# Patient Record
Sex: Female | Born: 1935
Health system: Southern US, Community
[De-identification: ages and names within clinical notes are randomized; demographics above are authoritative.]

## PROBLEM LIST (undated history)

## (undated) DIAGNOSIS — K219 Gastro-esophageal reflux disease without esophagitis: Secondary | ICD-10-CM

## (undated) DIAGNOSIS — C4492 Squamous cell carcinoma of skin, unspecified: Secondary | ICD-10-CM

## (undated) DIAGNOSIS — N3090 Cystitis, unspecified without hematuria: Secondary | ICD-10-CM

## (undated) DIAGNOSIS — G2581 Restless legs syndrome: Secondary | ICD-10-CM

## (undated) DIAGNOSIS — M1990 Unspecified osteoarthritis, unspecified site: Secondary | ICD-10-CM

## (undated) DIAGNOSIS — M8580 Other specified disorders of bone density and structure, unspecified site: Secondary | ICD-10-CM

## (undated) DIAGNOSIS — K589 Irritable bowel syndrome without diarrhea: Secondary | ICD-10-CM

## (undated) DIAGNOSIS — K8020 Calculus of gallbladder without cholecystitis without obstruction: Secondary | ICD-10-CM

## (undated) DIAGNOSIS — K635 Polyp of colon: Secondary | ICD-10-CM

## (undated) DIAGNOSIS — M7500 Adhesive capsulitis of unspecified shoulder: Secondary | ICD-10-CM

## (undated) DIAGNOSIS — K449 Diaphragmatic hernia without obstruction or gangrene: Secondary | ICD-10-CM

## (undated) HISTORY — PX: CHOLECYSTECTOMY: SHX55

## (undated) HISTORY — PX: MOHS SURGERY: SUR867

## (undated) HISTORY — DX: Gastro-esophageal reflux disease without esophagitis: K21.9

## (undated) HISTORY — DX: Polyp of colon: K63.5

## (undated) HISTORY — PX: APPENDECTOMY: SHX54

## (undated) HISTORY — PX: CATARACT EXTRACTION: SUR2

## (undated) HISTORY — DX: Diaphragmatic hernia without obstruction or gangrene: K44.9

## (undated) HISTORY — DX: Restless legs syndrome: G25.81

## (undated) HISTORY — DX: Irritable bowel syndrome, unspecified: K58.9

## (undated) HISTORY — DX: Cystitis, unspecified without hematuria: N30.90

## (undated) HISTORY — DX: Adhesive capsulitis of unspecified shoulder: M75.00

## (undated) HISTORY — DX: Squamous cell carcinoma of skin, unspecified: C44.92

## (undated) HISTORY — DX: Unspecified osteoarthritis, unspecified site: M19.90

## (undated) HISTORY — DX: Calculus of gallbladder without cholecystitis without obstruction: K80.20

## (undated) HISTORY — DX: Other specified disorders of bone density and structure, unspecified site: M85.80

---

## 2001-04-02 ENCOUNTER — Other Ambulatory Visit: Admission: RE | Admit: 2001-04-02 | Discharge: 2001-04-02 | Payer: Self-pay | Admitting: Family Medicine

## 2001-08-12 ENCOUNTER — Encounter: Payer: Self-pay | Admitting: Family Medicine

## 2001-08-12 ENCOUNTER — Encounter: Admission: RE | Admit: 2001-08-12 | Discharge: 2001-08-12 | Payer: Self-pay | Admitting: Family Medicine

## 2001-10-12 ENCOUNTER — Emergency Department (HOSPITAL_COMMUNITY): Admission: EM | Admit: 2001-10-12 | Discharge: 2001-10-13 | Payer: Self-pay | Admitting: *Deleted

## 2001-11-18 ENCOUNTER — Ambulatory Visit (HOSPITAL_COMMUNITY): Admission: RE | Admit: 2001-11-18 | Discharge: 2001-11-18 | Payer: Self-pay | Admitting: Gastroenterology

## 2001-11-18 ENCOUNTER — Encounter: Payer: Self-pay | Admitting: Gastroenterology

## 2002-04-16 ENCOUNTER — Encounter: Payer: Self-pay | Admitting: Emergency Medicine

## 2002-04-16 ENCOUNTER — Observation Stay (HOSPITAL_COMMUNITY): Admission: EM | Admit: 2002-04-16 | Discharge: 2002-04-17 | Payer: Self-pay | Admitting: Emergency Medicine

## 2003-05-09 ENCOUNTER — Other Ambulatory Visit: Admission: RE | Admit: 2003-05-09 | Discharge: 2003-05-09 | Payer: Self-pay | Admitting: Family Medicine

## 2005-01-15 ENCOUNTER — Encounter: Admission: RE | Admit: 2005-01-15 | Discharge: 2005-02-14 | Payer: Self-pay | Admitting: Family Medicine

## 2005-08-07 ENCOUNTER — Other Ambulatory Visit: Admission: RE | Admit: 2005-08-07 | Discharge: 2005-08-07 | Payer: Self-pay | Admitting: Family Medicine

## 2005-08-28 ENCOUNTER — Ambulatory Visit: Payer: Self-pay | Admitting: Gastroenterology

## 2005-10-15 ENCOUNTER — Encounter: Admission: RE | Admit: 2005-10-15 | Discharge: 2005-11-21 | Payer: Self-pay | Admitting: Family Medicine

## 2006-03-24 ENCOUNTER — Encounter: Admission: RE | Admit: 2006-03-24 | Discharge: 2006-03-24 | Payer: Self-pay | Admitting: Family Medicine

## 2006-04-09 ENCOUNTER — Ambulatory Visit: Payer: Self-pay | Admitting: Gastroenterology

## 2006-04-17 ENCOUNTER — Ambulatory Visit: Payer: Self-pay | Admitting: Gastroenterology

## 2006-04-17 ENCOUNTER — Encounter: Payer: Self-pay | Admitting: Gastroenterology

## 2006-08-12 ENCOUNTER — Ambulatory Visit: Payer: Self-pay | Admitting: Gastroenterology

## 2006-08-15 ENCOUNTER — Ambulatory Visit: Payer: Self-pay | Admitting: Gastroenterology

## 2006-09-03 ENCOUNTER — Ambulatory Visit: Payer: Self-pay | Admitting: Gastroenterology

## 2006-09-03 LAB — CONVERTED CEMR LAB
ALT: 14 units/L (ref 0–40)
AST: 17 units/L (ref 0–37)
Albumin: 4 g/dL (ref 3.5–5.2)
Alkaline Phosphatase: 50 units/L (ref 39–117)
Basophils Absolute: 0 10*3/uL (ref 0.0–0.1)
Basophils Relative: 0.5 % (ref 0.0–1.0)
Bilirubin, Direct: 0.1 mg/dL (ref 0.0–0.3)
Eosinophils Absolute: 0 10*3/uL (ref 0.0–0.6)
Eosinophils Relative: 0.6 % (ref 0.0–5.0)
HCT: 38.1 % (ref 36.0–46.0)
Hemoglobin: 13.4 g/dL (ref 12.0–15.0)
Lymphocytes Relative: 28.3 % (ref 12.0–46.0)
MCHC: 35.1 g/dL (ref 30.0–36.0)
MCV: 91.5 fL (ref 78.0–100.0)
Monocytes Absolute: 0.5 10*3/uL (ref 0.2–0.7)
Monocytes Relative: 7.7 % (ref 3.0–11.0)
Neutro Abs: 4.2 10*3/uL (ref 1.4–7.7)
Neutrophils Relative %: 62.9 % (ref 43.0–77.0)
Platelets: 323 10*3/uL (ref 150–400)
RBC: 4.17 M/uL (ref 3.87–5.11)
RDW: 12 % (ref 11.5–14.6)
TSH: 1.76 microintl units/mL (ref 0.35–5.50)
Total Bilirubin: 0.6 mg/dL (ref 0.3–1.2)
Total Protein: 6.9 g/dL (ref 6.0–8.3)
WBC: 6.6 10*3/uL (ref 4.5–10.5)

## 2006-09-04 ENCOUNTER — Ambulatory Visit: Payer: Self-pay | Admitting: Cardiology

## 2006-09-16 ENCOUNTER — Ambulatory Visit: Payer: Self-pay

## 2007-01-12 ENCOUNTER — Encounter: Admission: RE | Admit: 2007-01-12 | Discharge: 2007-01-12 | Payer: Self-pay | Admitting: Family Medicine

## 2007-10-14 ENCOUNTER — Ambulatory Visit: Payer: Self-pay | Admitting: Gastroenterology

## 2007-10-14 ENCOUNTER — Encounter: Payer: Self-pay | Admitting: Gastroenterology

## 2007-10-14 DIAGNOSIS — K219 Gastro-esophageal reflux disease without esophagitis: Secondary | ICD-10-CM

## 2007-10-14 LAB — CONVERTED CEMR LAB
ALT: 14 units/L (ref 0–35)
Albumin: 3.9 g/dL (ref 3.5–5.2)
BUN: 6 mg/dL (ref 6–23)
Basophils Relative: 0.9 % (ref 0.0–1.0)
Bilirubin, Direct: 0.1 mg/dL (ref 0.0–0.3)
CO2: 33 meq/L — ABNORMAL HIGH (ref 19–32)
Calcium: 9.3 mg/dL (ref 8.4–10.5)
Eosinophils Relative: 1.2 % (ref 0.0–5.0)
Glucose, Bld: 100 mg/dL — ABNORMAL HIGH (ref 70–99)
HCT: 37.7 % (ref 36.0–46.0)
Hemoglobin: 12.8 g/dL (ref 12.0–15.0)
Lymphocytes Relative: 33.1 % (ref 12.0–46.0)
Monocytes Relative: 10.7 % (ref 3.0–12.0)
Neutro Abs: 2.7 10*3/uL (ref 1.4–7.7)
RBC: 4.04 M/uL (ref 3.87–5.11)
Sodium: 140 meq/L (ref 135–145)
Total Protein: 6.5 g/dL (ref 6.0–8.3)
WBC: 5 10*3/uL (ref 4.5–10.5)

## 2010-08-08 ENCOUNTER — Encounter: Payer: Self-pay | Admitting: Cardiology

## 2010-08-31 ENCOUNTER — Encounter: Payer: Self-pay | Admitting: Cardiology

## 2010-08-31 ENCOUNTER — Institutional Professional Consult (permissible substitution) (INDEPENDENT_AMBULATORY_CARE_PROVIDER_SITE_OTHER): Payer: Medicare Other | Admitting: Cardiology

## 2010-08-31 DIAGNOSIS — R079 Chest pain, unspecified: Secondary | ICD-10-CM | POA: Insufficient documentation

## 2010-08-31 DIAGNOSIS — I739 Peripheral vascular disease, unspecified: Secondary | ICD-10-CM

## 2010-08-31 DIAGNOSIS — K219 Gastro-esophageal reflux disease without esophagitis: Secondary | ICD-10-CM

## 2010-09-03 ENCOUNTER — Other Ambulatory Visit: Payer: Self-pay | Admitting: Cardiology

## 2010-09-03 DIAGNOSIS — I739 Peripheral vascular disease, unspecified: Secondary | ICD-10-CM

## 2010-09-04 NOTE — Assessment & Plan Note (Signed)
Summary: consult; atypical chestpain. Madison [name] office [phone]. advantra w...  Medications Added DEXILANT 60 MG CPDR (DEXLANSOPRAZOLE) Take 1 tablet by mouth once a day      Allergies Added: NKDA  Visit Type:  Initial Consult Primary Provider:  Selena Batten  CC:  legs ache...mostly from knees down. burning in chest and stomach. pain in left side of neck radiating into chin.Marland Kitchen  History of Present Illness: 77 year old female with no prior cardiac history or evaluation of chest pain. She was seen in 2008 atypical chest pain. She had a stress Myoview which was normal with an ejection fraction of 75% and normal perfusion.  She is referred back now for her chest pain. It is substernal and radiates from her stomach. It is described as an indigestion and burning. It occurs after eating certain foods. It improves with Mylanta. It does not radiate to her upper extremities and there is no associated symptoms. It lasts 5 minutes and resolves. She does not have dyspnea on exertion. She also notices continuous pain in her lower extremities bilaterally. This is from the knees down. It is not exacerbated by walking. Note she has had chest pain intermittently for years.  Current Medications (verified): 1)  Viactiv 500-100-40  Chew (Calcium-Vitamin D-Vitamin K) .... Chew 2 Once Daily 2)  Vitamin D 1000 Unit  Caps (Cholecalciferol) .Marland Kitchen.. 1 Tab Once Daily 3)  Dexilant 60 Mg Cpdr (Dexlansoprazole) .... 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 cholecystectomy appendectomy C- section  Family  History: Reviewed history from 08/30/2010 and no changes required. mother deceased 72 colon cancer Father -deceased 39 yrs heart attack Brother #1  heart attack at age 18 Brother #2 deceased died age 53 lung cancer Brother #3 decaesed died age 39's lung cancer,had DM,HTN Sister #1 diagonesed with CA  bone cancer and lung cancer--cancer in remission.  Social History: Reviewed history from 08/30/2010 and no changes required. smoking cigarettes : Never smoked . Occasional ETOH. no recreational drug use. Excecise: walks Occupation: retired. Marital Status  Married, husband s/p CABG and no longer candidate for future STENT PLACEMENT or redo CABG CAD being managed medicallyThis creates alot of worry for Madison [name]  Review of Systems       Chronic lower extremity pain but no fevers or chills, productive cough, hemoptysis, dysphasia, odynophagia, melena, hematochezia, dysuria, hematuria, rash, seizure activity, orthopnea, PND, pedal edema, claudication. Remaining systems are negative.   Vital Signs:  Patient profile:   77 year old female Height:      64 inches Weight:      125.75 pounds BMI:     21.66 Pulse rate:   74 / minute Resp:     18 per minute BP sitting:   150 / 82  (left arm) Cuff size:   regular  Vitals Entered By: Celestia Khat, CMA (August 31, 2010 10:36 AM)  Physical Exam  General:  Well developed/well nourished in NAD Skin warm/dry Patient not depressed No peripheral clubbing Back-normal HEENT-normal/normal eyelids Neck supple/normal carotid upstroke bilaterally; no bruits; no JVD; no thyromegaly chest - CTA/ normal expansion CV - RRR/normal S1 and S2; no murmurs, rubs or gallops;  PMI  nondisplaced Abdomen -NT/ND, no HSM, no mass, + bowel sounds, no bruit 2+ femoral pulses, no bruits Ext-no edema, chords, 2+ DP Neuro-grossly nonfocal     EKG  Procedure date:  08/31/2010  Findings:      Sinus rhythm with no ST changes.  Impression &  Recommendations:  Problem # 1:  CLAUDICATION (ICD-443.9) Symptoms do not sound to be related to peripheral vascular disease. She has good pulses. If negative no further evaluation.  Problem # 2:  CHEST PAIN (ICD-786.50) Symptoms atypical and chronic. They are most consistent with gastroesophageal reflux disease. She is scheduled to see a gastroenterologist which I agree with. If there is any question we could repeat her stress test although I think cardiac ischemia is unlikely. She will contact us if she wishes to pursue this.  Problem # 3:  GERD (ICD-530.81) Management per GI. The following medications were removed from the medication list:    Aciphex 20 Mg Tbec (Rabeprazole sodium) .Marland Kitchen... 1 tablet once daily Her updated medication list for this problem includes:    Dexilant 60 Mg Cpdr (Dexlansoprazole) .Marland Kitchen... Take 1 tablet by mouth once a day  Other Orders: Arterial Duplex Lower Extremity (Arterial Duplex Low)  Patient Instructions: 1)  Your physician has requested that you have a lower  extremity arterial duplex.  This test is an ultrasound of the arteries in the legs or arms.  It looks at arterial blood flow in the legs and arms.  Allow one hour for Lower and Upper Arterial scans. There are no restrictions or special instructions.

## 2010-09-04 NOTE — Consult Note (Signed)
Summary: Madison [name] - Office Note  [name] - Office Note   Imported By: Marylou Mccoy 08/30/2010 16:01:07  _____________________________________________________________________  External Attachment:    Type:   Image     Comment:   External Document

## 2010-09-11 ENCOUNTER — Encounter: Payer: Medicare Other | Admitting: *Deleted

## 2010-09-14 ENCOUNTER — Encounter: Payer: Self-pay | Admitting: Gastroenterology

## 2010-09-14 ENCOUNTER — Ambulatory Visit (INDEPENDENT_AMBULATORY_CARE_PROVIDER_SITE_OTHER): Payer: Medicare Other | Admitting: Gastroenterology

## 2010-09-14 VITALS — BP 134/82 | HR 62 | Ht 64.0 in | Wt 127.0 lb

## 2010-09-14 DIAGNOSIS — K219 Gastro-esophageal reflux disease without esophagitis: Secondary | ICD-10-CM

## 2010-09-14 NOTE — Patient Instructions (Addendum)
You should change the way you are taking your antiacid medicine (dexilant) so that you are taking it 20-30 minutes prior to a breakfast meal as that is the way the pill is designed to work most effectively. Take pepcid (OTC), one pill at bedtime, and also one pill as needed thoughout the day. GERD handout given. Return to see Dr. Christella Hartigan as needed. Acid Reflux (GERD) Acid reflux is also called gastroesophageal reflux disease (GERD). Your stomach makes acid to help digest food. Acid reflux happens when acid from your stomach goes into the tube between your mouth and stomach (esophagus). Your stomach is protected from the acid, but this tube is not. When acid gets into the tube, it may cause a burning feeling in the chest (heartburn). Besides heartburn, other health problems can happen if the acid keeps going into the tube. Some causes of acid reflux include:  Being overweight.   Smoking.   Drinking alcohol.   Eating large meals.   Eating meals and then going to bed right away.   Eating certain foods.   Increased stomach acid production.  HOME CARE  Take all medicine as told by your doctor.   You may need to:   Lose weight.   Avoid alcohol.   Quit smoking.   Do not eat big meals. It is better to eat smaller meals throughout the day.   Do not eat a meal and then nap or go to bed.   Sleep with your head higher than your stomach.   Avoid foods that bother you.   You may need more tests, or you may need to see a special doctor.  GET HELP RIGHT AWAY IF:  You have chest pain that is different than before.   You have pain that goes to your arms, jaw, or between your shoulder blades.   You throw up (vomit) blood, dark brown liquid, or your throw up looks like coffee grounds.   You have trouble swallowing.   You have trouble breathing or cannot stop coughing.   You feel dizzy or pass out.   Your skin is cool, wet, and pale.   Your medicine is not helping.  MAKE SURE YOU:    Understand these instructions.   Will watch your condition.   Will get help right away if you are not doing well or get worse.  Document Released: 11/20/2007 Document Re-Released: 08/28/2009 [facility] Patient Information 2011 [place], [place].

## 2010-09-14 NOTE — Progress Notes (Signed)
Review of pertinent gastrointestinal problems: 1. Family history for colon cancer, personal history of tubular adenomas. Last colonoscopy November 2007 by Dr. Victorino Dike, tortuous although normal. Next colonoscopy November 2012.  2. Chronic gastroesophageal reflux disease. Has been on a variety of anti-acid, on a variety of PPIs with mixed results. Last esophagogastroduodenoscopy January 2005 by Dr. Victorino Dike. No esophagitis but a Schatzki's ring was seen. Hyperplastic polyps in the gastric body. EGD February 2008 by Dr. Christella Hartigan was normal except for previously sampled hyperplastic-appearing gastric polyps.    HPI: This is a very pleasant 77 year old woman whom I last saw about 2 years ago.  Had burning in stomach.  Has seen pcp recently, felt symptoms were GERD.  Has been taking PPI for a long time, most recently dexilant for at least the past year.  Still has occurances of burning in chest.  Can have acid regurg into throat laying down at night.  Usually eats dinner 6pm, occasional cereal at 8:30; laying down around 10:30 or 11pm.  Drinks one cup of coffee in AM. Does not eat much chocolate gum.  A glass of wine occasionally.    She has no weight loss, no overt GI bleeding, no dysphagia.   Physical Exam: Vital signs from this visit reviewed Constitutional: generally well-appearing Psychiatric: alert and oriented x3 Abdomen: soft, nontender, nondistended, no obvious ascites, no peritoneal signs, normal bowel sounds    Assessment and plan: 77 year old woman with likely GERD, acid related symptoms  She will stay on her proton pump inhibitor once daily. She will add H2 blocker at bedtime and on an as-needed basis throughout the day. I've given her instructions on acid, GERD related lifestyle modifications. She will return to see me on an as needed basis.

## 2010-11-02 NOTE — Discharge Summary (Signed)
[name], Madison                       ACCOUNT NO.:  [id number]   MEDICAL RECORD NO.:  [id number]                   PATIENT TYPE:  INP   LOCATION:  3713                                 FACILITY:  [facility]   PHYSICIAN:  Jackie Plum, M.D.             DATE OF BIRTH:  September 16, 1933   DATE OF ADMISSION:  04/16/2002  DATE OF DISCHARGE:  04/17/2002                                 DISCHARGE SUMMARY   PRIMARY CARE PHYSICIAN:  Dr. Pam Drown, M.D., [facility]   DISCHARGE DIAGNOSES:  1. Chest pain:  Ruled out for myocardial infarction.  Will have outpatient     Cardiolite stress test.  2. Gastroesophageal reflux disease, on Protonix.   ALLERGIES:  NO KNOWN DRUG ALLERGIES.   PROCEDURES:  None.   HISTORY OF PRESENT ILLNESS:  The patient presents to the emergency room on  04/16/2002.  Known history of GERD with worsening chest discomfort and  heaviness with burning times approximately two weeks.  The patient had  increased Protonix after speaking with her primary care physician to dosing  b.i.d. without relief.  Last week had an episode of everything going black  after getting off of exercise machine.  This episode did not include  presyncope.  The patient did complain of diaphoresis at the time.  Today  denies palpitations, nausea, vomiting, diaphoresis.  Increasing dyspnea on  exertion times one week, increasing fatigue times one week.  Denies  orthopnea or PND.  No fevers or chills.   HOSPITAL COURSE:  The patient was evaluated in the emergency department with  a chest x-ray, which showed no acute disease.  EKG, which showed sinus  rhythm without ectopy.  Patient's vital signs on presentation were a  temperature of 96.7, blood pressure 142/68, heart rate 67, respirations 18,  room air saturations were 97%.  The following labs were obtained in the  emergency room:  White cell count 5.8, hemoglobin 12.6, hematocrit 37.3,  platelet count 282,000, PT 14.1, INR 1.1,  PTT 31, CK 23, CK-MB 0.4, troponin  0.01.  UA was negative.  The patient was admitted to telemetry bed to rule  out MI.  The patient did not have any further episodes of chest discomfort  during her stay.  She remained in sinus rhythm on telemetry without ectopy.  She was provided with IV hydration and supplemental O2 as needed.  Other  laboratory values obtained include cholesterol panel:  total cholesterol  231, triglycerides 125, HDL 57, LDL 149, VLDL 25.   Being as patient ruled out for MI during her stay and was free of symptoms,  was discharged on 04/17/2002.  She is to have a follow up stress test  scheduled with [facility] next week.  Due to conversations with her  primary care physician in the week prior to her admission, stress testing  was already in the works for her and [facility] is familiar with her  case.  She will also need follow up for her elevated cholesterol levels.   Because patient's symptoms cannot clearly be delineated as either GERD or  cardiac in origin, patient was encouraged to continue to use over-the-  counter medications such as Maalox or Mylanta to increase her comfort at  least until she completes her stress testing.  The patient is discharged  home on Aspirin 325 mg a day.  The patient is discharged with no activity  restriction and is encouraged to follow low fat diet.   DISCHARGE LABS:  Please see narrative.   CONSULTATIONS:  None.   CONDITION ON DISCHARGE:  Substantially improved.  Pain free.  Stable.   DISPOSITION:  Discharged to home.   FOLLOW UP:  Cardiolite stress testing some time in the next week and follow  up with Dr. Corliss Blacker some time in the next week, please see narrative.     Ellender Hose. Christian Mate, M.D.    SMD/MEDQ  D:  04/17/2002  T:  04/19/2002  Job:  [id number]   cc:   Pam Drown, M.D.  [address]  [place]  [place] [address]  Fax: [phone]   [facility]  Cardiology Offices

## 2010-11-02 NOTE — Assessment & Plan Note (Signed)
Madison [name]                            CARDIOLOGY OFFICE NOTE   NAME:Blocher, DAVIE SAGONA                    MRN:          [id number]  DATE:09/04/2006                            DOB:          [birth year]    Madison [name] is a very pleasant 77 year old female with a past medical  history of gastroesophageal reflux disease who I am asked to evaluate  for chest pain. She has no prior cardiac history. She does have a long  history of gastroesophageal reflux disease. She states that her husband  had coronary artery bypass graft in December. Since that time she has  noticed that her symptoms have worsened. She now describes an almost  continuous pain in the substernal and upper chest area. It is described  as a burning. It does not radiate. There can be associated water brash  at times but there is also a tightness and pressure at other times.  There is no associated nausea, vomiting, shortness of breath, or  diaphoresis. It improves with lying flat. It is not related to  position  nor is it related to food. Belching does not improve her symptoms. Note  she did undergo EGD by Dr. Christella Hartigan and apparently was not found to have  anything significant by her report. Those records are not available.  Because of her persistent pain we were asked to further evaluate. Her  medications at present include;  1. Zegerid 40 mg p.o. daily.  2. Vitamin E.   She has no known drug allergies.   SOCIAL HISTORY:  She does not smoke, nor does she consume alcohol.   FAMILY HISTORY:  Negative for coronary artery disease (she did have a  father who had a myocardial infarction in his 56s).   PAST MEDICAL HISTORY:  There is no diabetes mellitus, hypertension, or  hyperlipidemia. She does have a history of gastroesophageal reflux  disease but there is no other medical illness noted. She has had a prior  cholecystectomy and appendectomy and also a previous C-section.   REVIEW OF  SYSTEMS:  She denies any headaches, or fevers, or chills.  There is no productive cough or hemoptysis. She does occasionally have  some dysphagia but there is no odynophagia. There is no melena, or  hematochezia. There is no dysuria, or hematuria. There is no rash, or  seizure activity. There is no orthopnea, PND, or pedal edema. There is  no claudication. The remaining systems are negative.   PHYSICAL EXAMINATION:  Today shows a blood pressure of 124/78, and her  pulse is 71. She weighs 126 pounds. She is well-developed, well-  nourished, no acute distress.  SKIN: Warm and dry.  She does not appear to be depressed and there is no peripheral clubbing.  HEENT: Unremarkable with no __________ .  NECK: Supple with a normal upstroke bilaterally and I can not appreciate  bruits. There is no jugular venous distension and no thyromegaly is  noted.  Her back is normal.  CHEST: Clear to auscultation with normal expansion.  CARDIOVASCULAR EXAM: Reveals regular rate and rhythm, normal S1, and S2.  There are no  murmurs, rubs, or gallops noted.  ABDOMINAL EXAM: Nontender, positive bowel sounds. No hepatosplenomegaly.  No mass appreciated. There is no abdominal bruits.  She has 2+ femoral pulses bilaterally, no bruits.  EXTREMITIES: Show no edema I can palpate, no cords. She has 2+dorsalis  pedis pulses bilaterally.  NEUROLOGIC EXAM: Grossly intact.   Her electrocardiogram shows a sinus rhythm at a rate of 71. There are no  ST changes noted.   DIAGNOSES:  1. Atypical chest pain.  2. History of gastroesophageal reflux disease.   PLAN:  Madison [name] presents for evaluation of chest pain of uncertain  etiology. I will plan to proceed with a stress Myoview for risk  stratification. If it shows normal perfusion then I do not think we need  to pursue further cardiac evaluation. Some of this may be related to  stress per Dr. Christella Hartigan notes. She will continue with her gastrointestinal  medications as  per the gastroenterologist. I will see her back on an as  needed basis pending the results of her Myoview.     Madolyn Frieze Jens Som, MD, [facility]  Electronically Signed    BSC/MedQ  DD: 09/04/2006  DT: 09/04/2006  Job #: [id number]   cc:   Rachael Fee, MD  Pam Drown, M.D.

## 2010-11-02 NOTE — Assessment & Plan Note (Signed)
[facility]                         GASTROENTEROLOGY OFFICE NOTE   NAME:Madison [name], Madison [name]                    MRN:          [id number]  DATE:08/12/2006                            DOB:          [birth year]    PRIMARY CARE PHYSICIAN:  Dr. Gweneth Dimitri.   GI PROBLEM LIST:  1. Family history for colon cancer, personal history of tubular      adenomas.  Last colonoscopy November 2007 by Dr. Victorino Dike,      tortuous although normal.  Next colonoscopy November 2012.  2. Chronic gastroesophageal reflux disease.  Has been on a variety of      anti-acid, on a variety of PPIs with mixed results.  Last      esophagogastroduodenoscopy January 2005 by Dr. Victorino Dike.  No      esophagitis but a Schatzki's ring was seen.  Hyperplastic polyps in      the gastric body.   INTERVAL HISTORY:  This is the first time I have met Madison [name], she  has been cared for by Dr. Victorino Dike previously.  She tells me she has  had gastroesophageal reflux disease for quite a long time and was doing  well on Aciphex once daily until December of 2007 when her husband had a  heart attack.  Since then she has had troubles with chest discomforts,  indigestion, belching, burning sensation.  She had an echocardiogram and  she tells me that was normal.  I can not tell that she has had any other  cardiac workup.  Her internist changed her from Aciphex to Zegerid and  she has been taking Zegerid 20-30 minutes prior to her breakfast and her  dinner meals.  She has not noticed much of a change since then.  She  does have rare intermittent liquid and solid food dysphagia.   CURRENT MEDICATIONS:  1. Vitamin E.  2. Zegerid twice daily.  3. Actonel 35 mg once a week.   PHYSICAL EXAMINATION:  Afebrile, vital signs stable.  CONSTITUTIONAL:  Generally well-appearing.  NEUROLOGIC:  Alert and oriented x3.  EYES:  Extraocular movements intact.  HEART: Regular rate and rhythm.  ABDOMEN:  Soft,  nontender, nondistended, normal bowel sounds.   ASSESSMENT/PLAN:  A 15s-year-old woman with probable chronic  gastroesophageal reflux disease, history of Schatzki's ring.   I will arrange for her to have an esophagogastroduodenoscopy at her  soonest convenience.  I did notice on her medicine list that she is on  Actonel, this can cause esophageal ulcerations.  I will discuss this  with her in more detail when she comes in for her procedure later this  week.  It  is best to be taking these upright and not laying down within at least  35 minutes to an hour following taking the pill.     Rachael Fee, MD  Electronically Signed    DPJ/MedQ  DD: 08/12/2006  DT: 08/12/2006  Job #: [id number]   cc:   Pam Drown, M.D.

## 2010-11-02 NOTE — Assessment & Plan Note (Signed)
[facility]                           GASTROENTEROLOGY OFFICE NOTE   NAME:[name], Madison GILLISPIE                    MRN:          [id number]  DATE:04/09/2006                            DOB:          09/12/1933    The patient comes in April 09, 2006, says she has been having some rectal  pain.  She is due for colonoscopy.  She says she sees some bleeding from  hemorrhoids at times.  She denies any upper GI symptoms.  She is mainly  constipated.  She has a known fixed left colon, and a somewhat tortuous left  colon.  She has known history of constipation with irritable bowel syndrome.  She also has a history of her mother having died from colon cancer.  She has  known GERD.  We talked about her constipation, and I told her she needed  some Senokot, prunes, and maybe some MiraLax for regular bowel habits.  She  was afraid to take medications for her constipation, but I think this is why  she has hemorrhoids and why she is having lower GI symptoms.   PHYSICAL EXAMINATION:  VITAL SIGNS:  Weight 127.  Blood pressure 140/58.  Pulse 68 and regular.  Neck, head  and extremities are all unremarkable.   IMPRESSION:  1. History of gastroesophageal reflux disease, taking Acufex daily.  2. History of constipation with irritable bowel syndrome in a patient who      has a family history of colon cancer.  3. Status post colon polyps.   RECOMMENDATIONS:  1. She will be scheduled for a colonoscopic examination.  2. She should take the medications I suggested previously.            ______________________________  Ulyess Mort, MD      SML/MedQ  DD:  04/09/2006  DT:  04/10/2006  Job #:  [id number]

## 2010-11-02 NOTE — Assessment & Plan Note (Signed)
[facility]                         GASTROENTEROLOGY OFFICE NOTE   NAME:[name], Madison [name] ELROD                    MRN:          [id number]  DATE:09/03/2006                            DOB:          [birth year]    REFERRING PHYSICIAN:  Pam Drown, M.D.   PRIMARY CARE PHYSICIAN:  Pam Drown, M.D.   GI PROBLEM LIST:  1. Family history for colon cancer, personal history of tubular      adenomas.  Last colonoscopy November 2007 by Dr. Victorino Dike,      tortuous although normal.  Next colonoscopy November 2012.  2. Chronic gastroesophageal reflux disease.  Has been on a variety of      anti-acid, on a variety of PPIs with mixed results.  Last      esophagogastroduodenoscopy January 2005 by Dr. Victorino Dike.  No      esophagitis but a Schatzki's ring was seen.  Hyperplastic polyps in      the gastric body.  EGD February 2008 by Dr. Christella Hartigan was normal      except for previously sampled hyperplastic-appearing gastric      polyps.   INTERVAL HISTORY:  I last saw Madison [name] [name] at the time of her EGD 3 weeks ago.  Since then, she is continuing to have the same symptoms that she has had  since her husband had a heart attack this past December.  She has nearly  constant chest discomfort, belching, burning sensation.  She used to be  on AcipHex on a daily basis, and that controlled her mild chronic GERD.  Since then, that has been changed to Zegerid and/or Protonix.  Sucralfate was added.  PPI was increased to twice daily.  None of these  changes have made a difference in her chest or upper GI symptoms.  She  tells me she had an EKG with her primary care physician, and that it was  normal.  She is wondering if stress causing this, or perhaps this is her  heart.   CURRENT MEDICATIONS:  1. Vitamin E.  2. Zegerid 40 mg twice daily.  3. Actonel 35 mg once a week.   PHYSICAL EXAMINATION:  Weight 126 pounds.  Blood pressure 122/68.  Pulse  84.  CONSTITUTIONAL:   Generally well appearing.  NEUROLOGIC:  Alert and oriented x3.  LUNGS:  Clear to auscultation bilaterally.  HEART:  Regular rate and rhythm.  ABDOMEN:  Soft and non-tender.  Non-distended.  Normal bowel sounds.   ASSESSMENT AND PLAN:  A 77 year old woman with 2 to 3 months of chest  discomfort, epigastric burning.   EGD was normal, and manipulating her PPIs to maximum strength has not  made any difference with her discomforts.  She admits to being under a  lot more stress lately, and asks for an anxiolytic, which may indeed  help her, but I am not comfortable giving her that without her primary  care physician being aware, and without a more thorough cardiac workup.  She exercises, and has no chest discomforts when exercising, so I think  it is unlikely to be her heart, but I do not  see that she has had a  stress test, and with her ongoing pains at her age, I think she may  benefit from a more thorough cardiac evaluation.  It is not clear that  her symptoms are from GERD.  Certainly, normal EGD argues against that,  and zero response to proton pump inhibitors also argues against that.  As her gallbladder has been removed already, I will get a basic set of  labs including a CBC, complete metabolic profile, and thyroid testing.  She will remain on once-daily proton pump inhibitor for now.   I recommended she get back to see her primary care physician, and we  will arrange for her to have a Cardiology consultation.     Rachael Fee, MD  Electronically Signed    DPJ/MedQ  DD: 09/03/2006  DT: 09/03/2006  Job #: [id number]   cc:   Pam Drown, M.D.

## 2011-03-27 ENCOUNTER — Encounter: Payer: Self-pay | Admitting: Gastroenterology

## 2011-03-27 ENCOUNTER — Ambulatory Visit (INDEPENDENT_AMBULATORY_CARE_PROVIDER_SITE_OTHER): Payer: Medicare Other | Admitting: Gastroenterology

## 2011-03-27 VITALS — BP 130/70 | HR 68 | Ht 64.0 in | Wt 126.2 lb

## 2011-03-27 DIAGNOSIS — K589 Irritable bowel syndrome without diarrhea: Secondary | ICD-10-CM

## 2011-03-27 DIAGNOSIS — Z86010 Personal history of colonic polyps: Secondary | ICD-10-CM

## 2011-03-27 MED ORDER — PEG-KCL-NACL-NASULF-NA ASC-C 100 G PO SOLR: 1.0000 | ORAL | Status: DC

## 2011-03-27 NOTE — Progress Notes (Signed)
Review of pertinent gastrointestinal problems:  1. Family history for colon cancer, personal history of tubular adenomas. Last colonoscopy November 2007 by Dr. Victorino Dike, tortuous although normal. Next colonoscopy November 2012.  2. Chronic gastroesophageal reflux disease. Has been on a variety of anti-acid, on a variety of PPIs with mixed results. esophagogastroduodenoscopy January 2005 by Dr. Victorino Dike. No esophagitis but a Schatzki's ring was seen. Hyperplastic polyps in the gastric body. EGD February 2008 by Dr. Christella Hartigan was normal except for previously sampled hyperplastic-appearing gastric polyps.    HPI: This is a   very pleasant 77 year old woman whom I last saw about 6 or 7 months ago. Her upper GI symptoms, GERD-like dyspepsia and improved.  Today she is explaining  lower abdominal pains bilaterally.  Had some constipation, then left sided discomforts which migrated to the right.  Normally not an issue.  No fevers or chills.  No overt bleeding.  Overall weight is up, eating more.  Her discomforts often improve if she passes gas or moves her bowels.    Past Medical History:   Hiatal hernia                                                GERD (gastroesophageal reflux disease)                       IBS (irritable bowel syndrome)                               Osteopenia                                                   Cancer, skin, squamous cell                                  Restless leg syndrome                                        Frozen shoulder                                                Comment:left   Past Surgical History:   CHOLECYSTECTOMY                                              APPENDECTOMY                                                 CESAREAN SECTION  reports that she has never smoked. She has never used smokeless tobacco. She reports that she drinks alcohol. She reports that she does not use illicit  drugs.  family history includes Bone cancer in her sister; Colon cancer (age of onset:80) in her mother; Diabetes in her brother; Heart disease in her brother and father; Hypertension in her brother; and Lung cancer in her brother and sister.    Current medicines and allergies were reviewed in [facility]    Physical Exam: BP 130/70  Pulse 68  Ht 5\' 4"  (1.626 m)  Wt 126 lb 3.2 oz (57.244 kg)  BMI 21.66 kg/m2 Constitutional: generally well-appearing Psychiatric: alert and oriented x3 Abdomen: soft, nontender, nondistended, no obvious ascites, no peritoneal signs, normal bowel sounds     Assessment and plan: 77 y.o. female with IBS-like discomforts, personal history of precancerous colon call polyps, family history of colon cancer  She is due for colonoscopy in the next month or 2 for routine polyp surveillance, family history of colon cancer and we will proceed with that at her soonest convenience.  I offered a trial of antispasmodic medicines however she has tried these in the past and it caused significant constipation that is worse than her IBS-like cramping.

## 2011-03-27 NOTE — Patient Instructions (Signed)
You will be set up for a colonoscopy.  

## 2011-05-07 ENCOUNTER — Telehealth: Payer: Self-pay | Admitting: Internal Medicine

## 2011-05-07 NOTE — Telephone Encounter (Signed)
The patient called to report that she had 3 glasses of MoviPrep and is full and bloated feels like she is going to vomit. She's actually had some dry heaves. She did have 1 bowel movement so far. She says there is no way she can finish this prep and completed for a colonoscopy tomorrow. We discussed alternative prep methods tonight but she says she has a weak stomach and simply cannot drink any more.  She requested she cancel the procedure and reschedule in the future months, using an alternative prep. It does sound like she tolerated a MiraLax prep in 2007. She is thinking of having a colonoscopy in the afternoon so that she'll be able to complete her prep in adequate time before that, when she rescheduled.

## 2011-05-08 ENCOUNTER — Other Ambulatory Visit: Payer: Medicare Other | Admitting: Gastroenterology

## 2011-05-08 NOTE — Telephone Encounter (Signed)
Patty, Can you call her today.  Let's try a different prep at a time she think she can complete it better.  Thanks

## 2011-05-15 ENCOUNTER — Telehealth: Payer: Self-pay | Admitting: Gastroenterology

## 2011-05-15 NOTE — Telephone Encounter (Signed)
Left message on machine to call back  

## 2011-05-16 ENCOUNTER — Encounter: Payer: Self-pay | Admitting: Gastroenterology

## 2011-05-16 NOTE — Telephone Encounter (Signed)
Unable to reach pt by phone letter mailed.  

## 2011-05-21 ENCOUNTER — Telehealth: Payer: Self-pay

## 2011-05-21 NOTE — Telephone Encounter (Signed)
Pt returned call and would like to reschedule after the new year

## 2011-05-28 NOTE — Telephone Encounter (Signed)
See other note

## 2011-10-07 ENCOUNTER — Encounter: Payer: Self-pay | Admitting: Gastroenterology

## 2011-10-07 ENCOUNTER — Ambulatory Visit (INDEPENDENT_AMBULATORY_CARE_PROVIDER_SITE_OTHER): Payer: Medicare Other | Admitting: Gastroenterology

## 2011-10-07 VITALS — BP 128/80 | HR 78 | Ht 63.5 in | Wt 127.0 lb

## 2011-10-07 DIAGNOSIS — K5909 Other constipation: Secondary | ICD-10-CM

## 2011-10-07 DIAGNOSIS — K5900 Constipation, unspecified: Secondary | ICD-10-CM

## 2011-10-07 MED ORDER — MOVIPREP 100 G PO SOLR: 1.0000 | ORAL | Status: DC

## 2011-10-07 NOTE — Patient Instructions (Addendum)
Try miralax one dose every morning. This is OTC, and is safe to take daily for long periods. You will be set up for a colonoscopy (this time we will use miralax, gatorade prep) for surveillance of polyps and LLQ pains).

## 2011-10-07 NOTE — Progress Notes (Signed)
Review of pertinent gastrointestinal problems:  1. Family history for colon cancer, personal history of tubular adenomas. Last colonoscopy November 2007 by Dr. Victorino Dike, tortuous although normal. Next colonoscopy November 2012.  Was scheduled for colonoscopy 2012 but she had difficulty with the prep (moviprep) and she canceled it. 2. Chronic gastroesophageal reflux disease. Has been on a variety of anti-acid, on a variety of PPIs with mixed results. esophagogastroduodenoscopy January 2005 by Dr. Victorino Dike. No esophagitis but a Schatzki's ring was seen. Hyperplastic polyps in the gastric body. EGD February 2008 by Dr. Christella Hartigan was normal except for previously sampled hyperplastic-appearing gastric polyps.   HPI: This is a  very pleasant 78 year old woman whom I saw for 5 months ago. She was scheduled for a colonoscopy but had difficulty with the prep and canceled. She is here to discuss that again.  She was unable to tolerate moviprep.    She has been having left sided pains again.  Mentioned in previously.  Worse after eating. Passing gas helps.  Moving bowels helps.  She is leaving the end of may and is concerned about likely constipation.  No dysphagia, no overt bleeding, overall her weight is stable.   Past Medical History  Diagnosis Date  . Hiatal hernia   . GERD (gastroesophageal reflux disease)   . IBS (irritable bowel syndrome)   . Osteopenia   . Cancer, skin, squamous cell   . Restless leg syndrome   . Frozen shoulder     left     Past Surgical History  Procedure Date  . Cholecystectomy   . Appendectomy   . Cesarean section     Current Outpatient Prescriptions  Medication Sig Dispense Refill  . ALPRAZolam (XANAX) 0.25 MG tablet Take 0.25 mg by mouth at bedtime as needed.      . Calcium-Vitamin D-Vitamin K (VIACTIV) 500-100-40 MG-UNT-MCG CHEW Chew 2 each by mouth daily.        Marland Kitchen dexlansoprazole (DEXILANT) 60 MG capsule Take 60 mg by mouth daily.          Allergies  as of 10/07/2011  . (No Known Allergies)    Family History  Problem Relation Age of Onset  . Colon cancer Mother 32  . Heart disease Father     MI  . Heart disease Brother   . Lung cancer Brother     x3  . Diabetes Brother   . Hypertension Brother   . Bone cancer Sister   . Lung cancer Sister     remission    History   Social History  . Marital Status: Married    Spouse Name: N/A    Number of Children: 2  . Years of Education: N/A   Occupational History  . retired    Social History Main Topics  . Smoking status: Never Smoker   . Smokeless tobacco: Never Used  . Alcohol Use: Yes     occasionally  . Drug Use: No  . Sexually Active: Not on file   Other Topics Concern  . Not on file   Social History Narrative  . No narrative on file      Physical Exam: BP 128/80  Pulse 78  Ht 5' 3.5" (1.613 m)  Wt 127 lb (57.607 kg)  BMI 22.14 kg/m2 Constitutional: generally well-appearing Psychiatric: alert and oriented x3 Abdomen: soft, nontender, nondistended, no obvious ascites, no peritoneal signs, normal bowel sounds     Assessment and plan: 78 y.o. female with left sided abdominal pains that are  improved with passing gas or stool, family history of colon cancer  She is due for her five-year colonoscopy for elevated risk cancer screening. She will use a different prep than the one we tried several months ago since she was unable to keep it down. I think her left-sided discomforts are IBS related, perhaps mild constipation contributes. She'll try daily MiraLax.

## 2011-10-25 ENCOUNTER — Telehealth: Payer: Self-pay | Admitting: *Deleted

## 2011-10-25 ENCOUNTER — Other Ambulatory Visit: Payer: Medicare Other | Admitting: Gastroenterology

## 2011-10-25 NOTE — Telephone Encounter (Signed)
Called patient to see if she would be able to make an earlier appointment (arrive at 1pm for 2pm appointment) however her husband cant get off work any earlier.  She will keep her original appointment (arrive at 2pm for 3pm appointment).

## 2011-10-28 ENCOUNTER — Encounter: Payer: Self-pay | Admitting: Gastroenterology

## 2011-10-28 ENCOUNTER — Ambulatory Visit (AMBULATORY_SURGERY_CENTER): Payer: Medicare Other | Admitting: Gastroenterology

## 2011-10-28 VITALS — BP 152/77 | HR 76 | Temp 97.3°F | Resp 20 | Ht 63.0 in | Wt 127.0 lb

## 2011-10-28 DIAGNOSIS — K648 Other hemorrhoids: Secondary | ICD-10-CM

## 2011-10-28 DIAGNOSIS — Z1211 Encounter for screening for malignant neoplasm of colon: Secondary | ICD-10-CM

## 2011-10-28 DIAGNOSIS — Z86010 Personal history of colonic polyps: Secondary | ICD-10-CM

## 2011-10-28 MED ORDER — SODIUM CHLORIDE 0.9 % IV SOLN: 500.0000 mL | INTRAVENOUS | Status: DC

## 2011-10-28 NOTE — Patient Instructions (Signed)
YOU HAD AN ENDOSCOPIC PROCEDURE TODAY AT THE [facility]: Refer to the procedure report that was given to you for any specific questions about what was found during the examination.  If the procedure report does not answer your questions, please call your gastroenterologist to clarify.  If you requested that your care partner not be given the details of your procedure findings, then the procedure report has been included in a sealed envelope for you to review at your convenience later.  YOU SHOULD EXPECT: Some feelings of bloating in the abdomen. Passage of more gas than usual.  Walking can help get rid of the air that was put into your GI tract during the procedure and reduce the bloating. If you had a lower endoscopy (such as a colonoscopy or flexible sigmoidoscopy) you may notice spotting of blood in your stool or on the toilet paper. If you underwent a bowel prep for your procedure, then you may not have a normal bowel movement for a few days.  DIET: Your first meal following the procedure should be a light meal and then it is ok to progress to your normal diet.  A half-sandwich or bowl of soup is an example of a good first meal.  Heavy or fried foods are harder to digest and may make you feel nauseous or bloated.  Likewise meals heavy in dairy and vegetables can cause extra gas to form and this can also increase the bloating.  Drink plenty of fluids but you should avoid alcoholic beverages for 24 hours.  ACTIVITY: Your care partner should take you home directly after the procedure.  You should plan to take it easy, moving slowly for the rest of the day.  You can resume normal activity the day after the procedure however you should NOT DRIVE or use heavy machinery for 24 hours (because of the sedation medicines used during the test).    SYMPTOMS TO REPORT IMMEDIATELY: A gastroenterologist can be reached at any hour.  During normal business hours, 8:30 AM to 5:00 PM Monday through Friday,  call [phone].  After hours and on weekends, please call the GI answering service at [phone] who will take a message and have the physician on call contact you.   Following lower endoscopy (colonoscopy or flexible sigmoidoscopy):  Excessive amounts of blood in the stool  Significant tenderness or worsening of abdominal pains  Swelling of the abdomen that is new, acute  Fever of 100F or higher    FOLLOW UP: If any biopsies were taken you will be contacted by phone or by letter within the next 1-3 weeks.  Call your gastroenterologist if you have not heard about the biopsies in 3 weeks.  Our staff will call the home number listed on your records the next business day following your procedure to check on you and address any questions or concerns that you may have at that time regarding the information given to you following your procedure. This is a courtesy call and so if there is no answer at the home number and we have not heard from you through the emergency physician on call, we will assume that you have returned to your regular daily activities without incident.  SIGNATURES/CONFIDENTIALITY: You and/or your care partner have signed paperwork which will be entered into your electronic medical record.  These signatures attest to the fact that that the information above on your After Visit Summary has been reviewed and is understood.  Full responsibility of the confidentiality   of this discharge information lies with you and/or your care-partner.   INFORMATION ON HEMORRHOIDS GIVEN TO YOU TODAY 

## 2011-10-28 NOTE — Progress Notes (Signed)
Patient did not experience any of the following events: a burn prior to discharge; a fall within the facility; wrong site/side/patient/procedure/implant event; or a hospital transfer or hospital admission upon discharge from the facility. (G8907) Patient did not have preoperative order for IV antibiotic SSI prophylaxis. (G8918)  

## 2011-10-28 NOTE — Op Note (Signed)
 [facility] [address] [place], [place]  [address]  COLONOSCOPY PROCEDURE REPORT  PATIENT:  Madison [name], Madison [name]  MR#:  [id number] BIRTHDATE:  July 25, 1933, 78 yrs. old  GENDER:  female ENDOSCOPIST:  Rachael Fee, MD PROCEDURE DATE:  10/28/2011 PROCEDURE:  Colonoscopy 19147 ASA CLASS:  Class II INDICATIONS:  Family history for colon cancer, personal history of tubular adenomas. Last colonoscopy November 2007 by Dr. Victorino Dike, tortuous although normal. Next colonoscopy November 2012. Was scheduled for colonoscopy 2012 but she had difficulty with the prep (moviprep) MEDICATIONS:  Fentanyl 75 mcg IV, These medications were titrated to patient response per physician's verbal order, Versed 6 mg IV  DESCRIPTION OF PROCEDURE:   After the risks benefits and alternatives of the procedure were thoroughly explained, informed consent was obtained.  Digital rectal exam was performed and revealed no rectal masses.   The LB [id number] endoscope was introduced through the anus and advanced to the cecum, which was identified by both the appendix and ileocecal valve, without limitations.  The quality of the prep was good..  The instrument was then slowly withdrawn as the colon was fully examined. <<PROCEDUREIMAGES>> FINDINGS:  Internal Hemorrhoids were found (see image6), one was slightly inflamed appearing but was soft on probing with forceps and DRE.   This was otherwise a normal examination of the colon (see image1 and image2).   Retroflexed views in the rectum revealed no abnormalities. COMPLICATIONS:  None  ENDOSCOPIC IMPRESSION: 1) Internal hemorrhoids 2) Otherwise normal examination; no polyp or cancers  RECOMMENDATIONS: 1) Given your age, you will not need another colonoscopy for colon cancer screening or polyp surveillance. These types of tests usually stop around the age 78.  ______________________________ Rachael Fee, MD  n. eSIGNED:   Rachael Fee at 10/28/2011 03:04 PM  Howell Rucks, [phone]

## 2011-10-29 ENCOUNTER — Telehealth: Payer: Self-pay | Admitting: *Deleted

## 2011-10-29 NOTE — Telephone Encounter (Signed)
  Follow up Call-  Call back number 10/28/2011  Post procedure Call Back phone  # [phone]  Permission to leave phone message Yes     Patient questions:  Do you have a fever, pain , or abdominal swelling? no Pain Score  0 *  Have you tolerated food without any problems? yes  Have you been able to return to your normal activities? yes  Do you have any questions about your discharge instructions: Diet   no Medications  no Follow up visit  no  Do you have questions or concerns about your Care? no  Actions: * If pain score is 4 or above: No action needed, pain <4.

## 2011-11-01 ENCOUNTER — Other Ambulatory Visit: Payer: Medicare Other | Admitting: Gastroenterology

## 2011-11-05 ENCOUNTER — Other Ambulatory Visit: Payer: Self-pay | Admitting: Family Medicine

## 2011-11-05 DIAGNOSIS — R1032 Left lower quadrant pain: Secondary | ICD-10-CM

## 2011-11-06 ENCOUNTER — Ambulatory Visit
Admission: RE | Admit: 2011-11-06 | Discharge: 2011-11-06 | Disposition: A | Discharge status: Home / Self Care | Payer: Medicare Other | Source: Ambulatory Visit | Attending: Family Medicine | Admitting: Family Medicine

## 2011-11-06 DIAGNOSIS — R1032 Left lower quadrant pain: Secondary | ICD-10-CM

## 2012-02-25 ENCOUNTER — Other Ambulatory Visit: Payer: Self-pay | Admitting: Dermatology

## 2012-05-04 ENCOUNTER — Other Ambulatory Visit: Payer: Self-pay | Admitting: Dermatology

## 2013-04-20 ENCOUNTER — Telehealth: Payer: Self-pay | Admitting: Gastroenterology

## 2013-04-20 NOTE — Telephone Encounter (Signed)
Pt will call back with the PPI that her insurance will cover

## 2013-04-21 ENCOUNTER — Telehealth: Payer: Self-pay | Admitting: Gastroenterology

## 2013-04-22 MED ORDER — PANTOPRAZOLE SODIUM 40 MG PO TBEC: 40.0000 mg | DELAYED_RELEASE_TABLET | Freq: Every day | ORAL | Status: DC

## 2013-04-22 NOTE — Telephone Encounter (Signed)
Pt has been notified that the prescription has been sent. °

## 2013-09-02 ENCOUNTER — Other Ambulatory Visit (HOSPITAL_COMMUNITY): Payer: Self-pay | Admitting: Orthopedic Surgery

## 2013-09-02 DIAGNOSIS — M79604 Pain in right leg: Secondary | ICD-10-CM

## 2013-09-07 ENCOUNTER — Other Ambulatory Visit (HOSPITAL_COMMUNITY): Payer: Self-pay | Admitting: Orthopedic Surgery

## 2013-09-07 ENCOUNTER — Encounter (HOSPITAL_COMMUNITY)
Admission: RE | Admit: 2013-09-07 | Discharge: 2013-09-07 | Disposition: A | Discharge status: Home / Self Care | Payer: Medicare HMO | Source: Ambulatory Visit | Attending: Orthopedic Surgery | Admitting: Orthopedic Surgery

## 2013-09-07 ENCOUNTER — Ambulatory Visit (HOSPITAL_COMMUNITY)
Admission: RE | Admit: 2013-09-07 | Discharge: 2013-09-07 | Disposition: A | Discharge status: Home / Self Care | Payer: Medicare HMO | Source: Ambulatory Visit | Attending: Orthopedic Surgery | Admitting: Orthopedic Surgery

## 2013-09-07 DIAGNOSIS — M79604 Pain in right leg: Secondary | ICD-10-CM

## 2013-09-07 DIAGNOSIS — M79609 Pain in unspecified limb: Secondary | ICD-10-CM | POA: Insufficient documentation

## 2013-09-07 DIAGNOSIS — M545 Low back pain, unspecified: Secondary | ICD-10-CM | POA: Insufficient documentation

## 2013-09-07 MED ORDER — TECHNETIUM TC 99M MEDRONATE IV KIT
25.0000 | PACK | Freq: Once | INTRAVENOUS | Status: AC | PRN
  Administered 2013-09-07: 25 via INTRAVENOUS

## 2014-07-28 ENCOUNTER — Other Ambulatory Visit: Payer: Self-pay | Admitting: Dermatology

## 2014-09-05 ENCOUNTER — Encounter: Payer: Self-pay | Admitting: Gastroenterology

## 2014-10-28 ENCOUNTER — Ambulatory Visit (INDEPENDENT_AMBULATORY_CARE_PROVIDER_SITE_OTHER): Payer: Medicare HMO | Admitting: Gastroenterology

## 2014-10-28 ENCOUNTER — Encounter: Payer: Self-pay | Admitting: Gastroenterology

## 2014-10-28 VITALS — BP 138/70 | HR 76 | Ht 63.75 in | Wt 121.5 lb

## 2014-10-28 DIAGNOSIS — K219 Gastro-esophageal reflux disease without esophagitis: Secondary | ICD-10-CM

## 2014-10-28 NOTE — Progress Notes (Signed)
Review of pertinent gastrointestinal problems:  1. Family history for colon cancer, personal history of tubular adenomas. Last colonoscopy November 2007 by Dr. Lyla Son, tortuous although normal. Next colonoscopy November 2012. Was scheduled for colonoscopy 2012 but she had difficulty with the prep (moviprep) and she canceled it.  10/2011 Colonoscopy Dr. Ardis Hughs with different prep; normal except small hemorrhoids.  Recommended no further CRC screening given age. 2. Chronic gastroesophageal reflux disease. Has been on a variety of anti-acid, on a variety of PPIs with mixed results. esophagogastroduodenoscopy January 2005 by Dr. Lyla Son. No esophagitis but a Schatzki's ring was seen. Hyperplastic polyps in the gastric body. EGD February 2008 by Dr. Ardis Hughs was normal except for previously sampled hyperplastic-appearing gastric polyps.   HPI: This is a  very pleasant 81 year old woman who was referred to me by Bing Matter, MDto evaluate  worsening pyrosis, GERD symptoms  Chief complaint is GERD  Was switched to protonix about a year ago.  Was taking 40 mg daily in AM.  Doing very well.  A month ago had pyrosis in AM, went to [facility].  Has been having a lot of belching, bubbling.  This was felt to be worsening of her chronic GERD and she was referred to see me to consider repeat upper endoscopy.  Wondered if coconut cake the day before had any cause.  Overall her weight has been stable.  No dysphagia.    Has intermittent burning in epigastrium.    Review of systems: Pertinent positive and negative review of systems were noted in the above HPI section. Complete review of systems was performed and was otherwise normal.   Past Medical History  Diagnosis Date  . Hiatal hernia   . GERD (gastroesophageal reflux disease)   . IBS (irritable bowel syndrome)   . Osteopenia   . Cancer, skin, squamous cell   . Restless leg syndrome   . Frozen shoulder     left   . Bladder  infection   . Arthritis   . Colon polyps   . Gallstones     Past Surgical History  Procedure Laterality Date  . Cholecystectomy    . Appendectomy    . Cesarean section      Current Outpatient Prescriptions  Medication Sig Dispense Refill  . ALPRAZolam (XANAX) 0.25 MG tablet Take 0.25 mg by mouth at bedtime as needed.    . Calcium-Vitamin D-Vitamin K (VIACTIV) 809-983-38 MG-UNT-MCG CHEW Chew 2 each by mouth daily.      . cetirizine (ZYRTEC) 10 MG tablet Take 5 mg by mouth daily.    . pantoprazole (PROTONIX) 40 MG tablet Take 1 tablet (40 mg total) by mouth daily. 90 tablet 3   No current facility-administered medications for this visit.    Allergies as of 10/28/2014  . (No Known Allergies)    Family History  Problem Relation Age of Onset  . Colon cancer Mother 45  . Heart disease Father     MI  . Heart disease Brother   . Lung cancer Brother     x3  . Diabetes Brother   . Hypertension Brother   . Bone cancer Sister   . Lung cancer Sister     remission    History   Social History  . Marital Status: Married    Spouse Name: N/A  . Number of Children: 2  . Years of Education: N/A   Occupational History  . retired    Social History Main Topics  . Smoking status:  Never Smoker   . Smokeless tobacco: Never Used  . Alcohol Use: Yes     Comment: occasionally  . Drug Use: No  . Sexual Activity: Not on file   Other Topics Concern  . Not on file   Social History Narrative     Physical Exam: Ht 5' 3.75" (1.619 m)  Wt 121 lb 8 oz (55.112 kg)  BMI 21.03 kg/m2 Constitutional: generally well-appearing Psychiatric: alert and oriented x3 Eyes: extraocular movements intact Mouth: oral pharynx moist, no lesions Neck: supple no lymphadenopathy Cardiovascular: heart regular rate and rhythm Lungs: clear to auscultation bilaterally Abdomen: soft, nontender, nondistended, no obvious ascites, no peritoneal signs, normal bowel sounds Extremities: no lower extremity  edema bilaterally Skin: no lesions on visible extremities   Assessment and plan: 81 y.o. female with  GERD, chronic  She has had a bit worse upper GI symptoms that may be acid related over the past month or so. She has had no alarm symptoms however and I recommended we try simply adding nightly H2 blocker. She will call to report on her symptoms in 5-6 weeks and if she has not noticed clear improvement then I would like to proceed with EGD. If she does however notice clear improvement in these upper GI symptoms then I do not think we need to proceed with invasive testing.   Owens Loffler, MD [facility] 10/28/2014, 8:34 AM  Cc: Bing Matter, MD

## 2014-10-28 NOTE — Patient Instructions (Signed)
Please start ranitidine 150mg  pill, take one pill at bedtime nightly. Continue protonix every morning (20-30 min before breakfast meal. Call Dr. Ardis Hughs' in 5-6 weeks to report on your response to the added antiacid meds.

## 2015-01-06 ENCOUNTER — Telehealth: Payer: Self-pay | Admitting: Gastroenterology

## 2015-01-06 NOTE — Telephone Encounter (Signed)
Right side abd pain seen GYN nothing found,  abd pain after eating and change in bowels to constipation.  Appt to see Dr Ardis Hughs on 01/09/15 11 am

## 2015-01-09 ENCOUNTER — Encounter: Payer: Self-pay | Admitting: Gastroenterology

## 2015-01-09 ENCOUNTER — Ambulatory Visit (INDEPENDENT_AMBULATORY_CARE_PROVIDER_SITE_OTHER): Payer: Medicare HMO | Admitting: Gastroenterology

## 2015-01-09 VITALS — BP 136/70 | HR 96 | Ht 63.75 in | Wt 122.1 lb

## 2015-01-09 DIAGNOSIS — R1310 Dysphagia, unspecified: Secondary | ICD-10-CM | POA: Diagnosis not present

## 2015-01-09 DIAGNOSIS — R1013 Epigastric pain: Secondary | ICD-10-CM

## 2015-01-09 NOTE — Progress Notes (Signed)
Review of pertinent gastrointestinal problems:  1. Family history for colon cancer, personal history of tubular adenomas. Last colonoscopy November 2007 by Dr. Lyla Son, tortuous although normal. Next colonoscopy November 2012. Was scheduled for colonoscopy 2012 but she had difficulty with the prep (moviprep) and she canceled it. 10/2011 Colonoscopy Dr. Ardis Hughs with different prep; normal except small hemorrhoids. Recommended no further CRC screening given age. 2. Chronic gastroesophageal reflux disease. Has been on a variety of anti-acid, on a variety of PPIs with mixed results. esophagogastroduodenoscopy January 2005 by Dr. Lyla Son. No esophagitis but a Schatzki's ring was seen. Hyperplastic polyps in the gastric body. EGD February 2008 by Dr. Ardis Hughs was normal except for previously sampled hyperplastic-appearing gastric polyps.   HPI: This is a   very pleasant 81 year old woman whom I last saw 2 or 3 months ago. At that time I felt her relatively new dyspepsia was GERD related. I adjusted her antiacid medicines by adding H2 blocker at bedtime every night. She made these adjustments and not really noticed much improvement.  Chief complaint is persistent dyspepsia  She is still bothered by bloating, gassiness after she eats. Her appetite has been a bit less than normal. She has been nauseous at times. No severe abdominal pains, no vomiting. No overt bleeding. No dysphagia   Past Medical History  Diagnosis Date  . Hiatal hernia   . GERD (gastroesophageal reflux disease)   . IBS (irritable bowel syndrome)   . Osteopenia   . Cancer, skin, squamous cell   . Restless leg syndrome   . Frozen shoulder     left   . Bladder infection   . Arthritis   . Colon polyps   . Gallstones     Past Surgical History  Procedure Laterality Date  . Cholecystectomy    . Appendectomy    . Cesarean section      Current Outpatient Prescriptions  Medication Sig Dispense Refill  . ALPRAZolam  (XANAX) 0.25 MG tablet Take 0.25 mg by mouth at bedtime as needed.    . Calcium-Vitamin D-Vitamin K (VIACTIV) 573-220-25 MG-UNT-MCG CHEW Chew 2 each by mouth daily.      . cetirizine (ZYRTEC) 10 MG tablet Take 5 mg by mouth daily.    . ciprofloxacin (CIPRO) 500 MG tablet Take 1 tablet by mouth every 12 (twelve) hours.    . pantoprazole (PROTONIX) 40 MG tablet Take 1 tablet (40 mg total) by mouth daily. 90 tablet 3  . RaNITidine HCl ([facility] RANITIDINE PO) Take by mouth at bedtime as needed.     No current facility-administered medications for this visit.    Allergies as of 01/09/2015  . (No Known Allergies)    Family History  Problem Relation Age of Onset  . Colon cancer Mother 25  . Heart disease Father     MI  . Heart disease Brother   . Lung cancer Brother     x3  . Diabetes Brother   . Hypertension Brother   . Bone cancer Sister   . Lung cancer Sister     remission    History   Social History  . Marital Status: Married    Spouse Name: N/A  . Number of Children: 2  . Years of Education: N/A   Occupational History  . retired    Social History Main Topics  . Smoking status: Never Smoker   . Smokeless tobacco: Never Used  . Alcohol Use: Yes     Comment: occasionally  . Drug Use:  No  . Sexual Activity: Not on file   Other Topics Concern  . Not on file   Social History Narrative     Physical Exam: BP 136/70 mmHg  Pulse 96  Ht 5' 3.75" (1.619 m)  Wt 122 lb 2 oz (55.396 kg)  BMI 21.13 kg/m2 Constitutional: generally well-appearing Psychiatric: alert and oriented x3 Abdomen: soft, nontender, nondistended, no obvious ascites, no peritoneal signs, normal bowel sounds   Assessment and plan: 81 y.o. female with dyspepsia  Adjusting her antiacid medicines did not seem to help her dyspepsia. I recommended we proceed with EGD at her soonest convenience check for peptic ulcer disease, H. pylori infection, gastritis.   Owens Loffler, MD [facility] 01/09/2015, 11:12 AM

## 2015-01-09 NOTE — Patient Instructions (Signed)
You will be set up for an upper endoscopy for dyspepsia.

## 2015-02-07 ENCOUNTER — Ambulatory Visit (AMBULATORY_SURGERY_CENTER): Payer: Medicare HMO | Admitting: Gastroenterology

## 2015-02-07 ENCOUNTER — Encounter: Payer: Self-pay | Admitting: Gastroenterology

## 2015-02-07 VITALS — BP 120/61 | HR 65 | Temp 98.4°F | Resp 16 | Ht 63.75 in | Wt 122.0 lb

## 2015-02-07 DIAGNOSIS — R1013 Epigastric pain: Secondary | ICD-10-CM

## 2015-02-07 DIAGNOSIS — K2990 Gastroduodenitis, unspecified, without bleeding: Secondary | ICD-10-CM | POA: Diagnosis not present

## 2015-02-07 DIAGNOSIS — K2970 Gastritis, unspecified, without bleeding: Secondary | ICD-10-CM

## 2015-02-07 DIAGNOSIS — K2950 Unspecified chronic gastritis without bleeding: Secondary | ICD-10-CM

## 2015-02-07 MED ORDER — SODIUM CHLORIDE 0.9 % IV SOLN: 500.0000 mL | INTRAVENOUS | Status: DC

## 2015-02-07 NOTE — Progress Notes (Signed)
Transferred to recovery room. A/O x3, pleased with MAC.  VSS.  Report to Suzanne, RN. 

## 2015-02-07 NOTE — Progress Notes (Signed)
Called to room to assist during endoscopic procedure.  Patient ID and intended procedure confirmed with present staff. Received instructions for my participation in the procedure from the performing physician.  

## 2015-02-07 NOTE — Patient Instructions (Signed)
YOU HAD AN ENDOSCOPIC PROCEDURE TODAY AT [facility]:   Refer to the procedure report that was given to you for any specific questions about what was found during the examination.  If the procedure report does not answer your questions, please call your gastroenterologist to clarify.  If you requested that your care partner not be given the details of your procedure findings, then the procedure report has been included in a sealed envelope for you to review at your convenience later.  YOU SHOULD EXPECT: Some feelings of bloating in the abdomen. Passage of more gas than usual.  Walking can help get rid of the air that was put into your GI tract during the procedure and reduce the bloating. If you had a lower endoscopy (such as a colonoscopy or flexible sigmoidoscopy) you may notice spotting of blood in your stool or on the toilet paper. If you underwent a bowel prep for your procedure, you may not have a normal bowel movement for a few days.  Please Note:  You might notice some irritation and congestion in your nose or some drainage.  This is from the oxygen used during your procedure.  There is no need for concern and it should clear up in a day or so.  SYMPTOMS TO REPORT IMMEDIATELY:    Following upper endoscopy (EGD)  Vomiting of blood or coffee ground material  New chest pain or pain under the shoulder blades  Painful or persistently difficult swallowing  New shortness of breath  Fever of 100F or higher  Black, tarry-looking stools  For urgent or emergent issues, a gastroenterologist can be reached at any hour by calling [phone].   DIET: Your first meal following the procedure should be a small meal and then it is ok to progress to your normal diet. Heavy or fried foods are harder to digest and may make you feel nauseous or bloated.  Likewise, meals heavy in dairy and vegetables can increase bloating.  Drink plenty of fluids but you should avoid alcoholic beverages  for 24 hours.  ACTIVITY:  You should plan to take it easy for the rest of today and you should NOT DRIVE or use heavy machinery until tomorrow (because of the sedation medicines used during the test).    FOLLOW UP: Our staff will call the number listed on your records the next business day following your procedure to check on you and address any questions or concerns that you may have regarding the information given to you following your procedure. If we do not reach you, we will leave a message.  However, if you are feeling well and you are not experiencing any problems, there is no need to return our call.  We will assume that you have returned to your regular daily activities without incident.  If any biopsies were taken you will be contacted by phone or by letter within the next 1-3 weeks.  Please call us at [phone] if you have not heard about the biopsies in 3 weeks.    SIGNATURES/CONFIDENTIALITY: You and/or your care partner have signed paperwork which will be entered into your electronic medical record.  These signatures attest to the fact that that the information above on your After Visit Summary has been reviewed and is understood.  Full responsibility of the confidentiality of this discharge information lies with you and/or your care-partner.  Thank-you for choosing [place] for your healthcare needs. Please, read all of the handouts given to you by your  recovery room nurse.

## 2015-02-07 NOTE — Op Note (Signed)
[facility]  [address] [place], [id number]   ENDOSCOPY PROCEDURE REPORT  PATIENT: Madison [name], Madison [name]  MR#: [id number] BIRTHDATE: [birth year] , 81  yrs. old GENDER: female ENDOSCOPIST: Milus Banister, MD PROCEDURE DATE:  02/07/2015 PROCEDURE:  EGD w/ biopsy ASA CLASS:     Class II INDICATIONS:  dyspepsia, abdominal pain. MEDICATIONS: Monitored anesthesia care and Propofol 125 mg IV TOPICAL ANESTHETIC: none  DESCRIPTION OF PROCEDURE: After the risks benefits and alternatives of the procedure were thoroughly explained, informed consent was obtained.  The LB [id number] endoscope was introduced through the mouth and advanced to the second portion of the duodenum , Without limitations.  The instrument was slowly withdrawn as the mucosa was fully examined.    There was mild distal gastritis.  Biopsies were taken and sent to pathology.  The proximal stomach contains multiple subcentimeter fleshy polyps (consistent with previously proven HP polyps).  The examination was otherwise normal.  Retroflexed views revealed no abnormalities.     The scope was then withdrawn from the patient and the procedure completed.  COMPLICATIONS: There were no immediate complications.  ENDOSCOPIC IMPRESSION: There was mild distal gastritis.  Biopsies were taken and sent to pathology.  The proximal stomach contains multiple subcentimeter fleshy polyps (consistent with previously proven HP polyps).  The examination was otherwise normal  RECOMMENDATIONS: Await final pathology. If there is no H. pylori, will likely recommend further testing (CT scan abdomen).  eSigned:  Milus Banister, MD 02/07/2015 2:58 PM

## 2015-02-08 ENCOUNTER — Telehealth: Payer: Self-pay

## 2015-02-08 NOTE — Telephone Encounter (Signed)
  Follow up Call-  Call back number 02/07/2015  Post procedure Call Back phone  # [phone]  Permission to leave phone message Yes     Patient questions:  Do you have a fever, pain , or abdominal swelling? No. Pain Score  0 *  Have you tolerated food without any problems? Yes.    Have you been able to return to your normal activities? Yes.    Do you have any questions about your discharge instructions: Diet   No. Medications  No. Follow up visit  No.  Do you have questions or concerns about your Care? No.  Actions: * If pain score is 4 or above: No action needed, pain <4.

## 2015-02-09 ENCOUNTER — Telehealth: Payer: Self-pay | Admitting: Gastroenterology

## 2015-02-09 NOTE — Telephone Encounter (Signed)
Pt had EGD on Tue and some loose stools yesterday. Has had constipation in the past and is going on a trip and wants to know what she can do in case this happens again.  The pt was advised on using miralax and titrate as needed.  Pt agreed and will call with any further concerns.  Pt was also wondering if her path has come back yet.  I did tell her that it was collected but not resulted and we will call her as soon as available.  Pt agreed and thanked me for calling

## 2015-02-13 ENCOUNTER — Telehealth: Payer: Self-pay | Admitting: Gastroenterology

## 2015-02-13 NOTE — Telephone Encounter (Signed)
Spoke with pt and let her know the results are not back yet. 

## 2015-02-15 ENCOUNTER — Telehealth: Payer: Self-pay | Admitting: Gastroenterology

## 2015-02-15 ENCOUNTER — Other Ambulatory Visit (INDEPENDENT_AMBULATORY_CARE_PROVIDER_SITE_OTHER): Payer: Medicare HMO

## 2015-02-15 ENCOUNTER — Other Ambulatory Visit: Payer: Self-pay

## 2015-02-15 DIAGNOSIS — R1084 Generalized abdominal pain: Secondary | ICD-10-CM | POA: Diagnosis not present

## 2015-02-15 LAB — BUN: BUN: 7 mg/dL (ref 6–23)

## 2015-02-15 LAB — CREATININE, SERUM: Creatinine, Ser: 0.57 mg/dL (ref 0.40–1.20)

## 2015-02-15 NOTE — Telephone Encounter (Signed)
Pt scheduled for CT of A/P at [facility] 02/16/15@3pm . Pt to arrive there at 2:45pm. Pt to be NPO after 11am, drink bottle 1 of contrast at 1pm and bottle 2 at 2pm. Pt to come for labs today. Pt aware.

## 2015-02-15 NOTE — Telephone Encounter (Signed)
Please let her know that the biopsies of her stomach showed no sign of infection, cancer; this does not explain her pains. She needs  CT scan of abdomen and pelvis with IV and oral contrast (for abd pain)

## 2015-02-15 NOTE — Telephone Encounter (Signed)
Pt calling for pathology results. States she has a bus trip coming up and she does not want to lose money by having to cancel the trip. States she is still having a lot of pain after she eats. Please advise.

## 2015-02-16 ENCOUNTER — Ambulatory Visit (INDEPENDENT_AMBULATORY_CARE_PROVIDER_SITE_OTHER)
Admission: RE | Admit: 2015-02-16 | Discharge: 2015-02-16 | Disposition: A | Discharge status: Home / Self Care | Payer: Medicare HMO | Source: Ambulatory Visit | Attending: Gastroenterology | Admitting: Gastroenterology

## 2015-02-16 DIAGNOSIS — R1084 Generalized abdominal pain: Secondary | ICD-10-CM

## 2015-02-16 MED ORDER — IOHEXOL 300 MG/ML  SOLN
100.0000 mL | Freq: Once | INTRAMUSCULAR | Status: AC | PRN
  Administered 2015-02-16: 100 mL via INTRAVENOUS

## 2015-02-21 ENCOUNTER — Telehealth: Payer: Self-pay | Admitting: Gastroenterology

## 2015-02-21 NOTE — Telephone Encounter (Signed)
See results note from today 

## 2015-02-21 NOTE — Telephone Encounter (Signed)
Dr Ardis Hughs can you please review?  The pt is going out of town on Saturday and is anxious for results.

## 2015-02-27 ENCOUNTER — Telehealth: Payer: Self-pay | Admitting: Gastroenterology

## 2015-02-27 NOTE — Telephone Encounter (Signed)
Pt states her right sided abdominal pain is worse. States she is having problems with diarrhea alternating with constipation. Requests to be seen. Pt scheduled to see Nicoletta Ba PA tomorrow at 2pm. Pt aware of appt.

## 2015-02-28 ENCOUNTER — Ambulatory Visit (INDEPENDENT_AMBULATORY_CARE_PROVIDER_SITE_OTHER): Payer: Medicare HMO | Admitting: Physician Assistant

## 2015-02-28 ENCOUNTER — Encounter: Payer: Self-pay | Admitting: Physician Assistant

## 2015-02-28 VITALS — BP 160/68 | HR 84 | Ht 63.75 in | Wt 118.0 lb

## 2015-02-28 DIAGNOSIS — K2990 Gastroduodenitis, unspecified, without bleeding: Secondary | ICD-10-CM

## 2015-02-28 DIAGNOSIS — Z8719 Personal history of other diseases of the digestive system: Secondary | ICD-10-CM | POA: Diagnosis not present

## 2015-02-28 DIAGNOSIS — K2970 Gastritis, unspecified, without bleeding: Secondary | ICD-10-CM | POA: Diagnosis not present

## 2015-02-28 MED ORDER — SUCRALFATE 1 G PO TABS: ORAL_TABLET | ORAL | Status: DC

## 2015-02-28 MED ORDER — PANTOPRAZOLE SODIUM 40 MG PO TBEC: 40.0000 mg | DELAYED_RELEASE_TABLET | Freq: Every day | ORAL | Status: DC

## 2015-02-28 NOTE — Progress Notes (Signed)
Patient ID: Madison [name], female   DOB: [birth year], 81 y.o.   MRN: [id number]   Subjective:    Patient ID: Madison [name], female    DOB: 1933/09/11, 81 y.o.   MRN: [id number]  [place] is a pleasant 81 year old white female known to Dr. Ardis Hughs who has recently been undergoing workup for upper abdominal burning pain and right-sided abdominal pain. She has been on Protonix 40 mg by mouth every morning long term for GERD. Earlier this summer when she started having burning abdominal discomfort Zantac 150 was added to her regimen in the evenings. Her symptoms persisted and she underwent EGD with Dr. Ardis Hughs on 02/07/2015 with finding of mild distal gastritis. He was also noted to have multiple subcentimeter polyps consistent with previously proven hyperplastic polyps. Biopsies were taken from the antrum which showed mild chronic gastritis negative for H. pylori and no evidence of dysplasia. She then underwent CT scan of the abdomen and pelvis on 02/16/2015 she was found to have a small lesion anteriorly in the left lobe of the liver measuring 2.0 x 1.7 cm with nodular peripheral enhancement likely representing a small hemangioma and stable in size, and 8 mm cyst in the right lobe of the liver unchanged and a 7 x 13 and 6 x 13 mm low-attenuation nodule between the splenic vein and left renal vein near the tail of the pancreas which appears to be a single bilobed cystic lesion with little change since 2013. Appendix and gallbladder absent atrophic uterus and ovaries unremarkable She  says she is still hurting that the Protonix and Zantac tach combination are not helping at all and she's having daily symptoms with burning upper abdominal discomfort which she describes as a "cold fire" in my stomach. He has been taking MiraLAX for constipation and that is working well. She is frustrated by persistence of her pain.  Review of Systems Pertinent positive and negative review of systems were noted in the above  HPI section.  All other review of systems was otherwise negative.  Outpatient Encounter Prescriptions as of 02/28/2015  Medication Sig  . ALPRAZolam (XANAX) 0.25 MG tablet Take 0.25 mg by mouth at bedtime as needed.  . Calcium-Vitamin D-Vitamin K (VIACTIV) 712-458-09 MG-UNT-MCG CHEW Chew 2 each by mouth daily.    . cetirizine (ZYRTEC) 10 MG tablet Take 5 mg by mouth daily.  . pantoprazole (PROTONIX) 40 MG tablet Take 1 tablet (40 mg total) by mouth daily.  . RaNITidine HCl ([facility]S RANITIDINE PO) Take by mouth at bedtime as needed.  . [DISCONTINUED] pantoprazole (PROTONIX) 40 MG tablet Take 1 tablet (40 mg total) by mouth daily.  . sucralfate (CARAFATE) 1 G tablet Take 1 tab between meals 3 times daily.   No facility-administered encounter medications on file as of 02/28/2015.   No Known Allergies Patient Active Problem List   Diagnosis Date Noted  . CLAUDICATION 08/31/2010  . CHEST PAIN 08/31/2010  . GERD 10/14/2007   Social History   Social History  . Marital Status: Married    Spouse Name: N/A  . Number of Children: 2  . Years of Education: N/A   Occupational History  . retired    Social History Main Topics  . Smoking status: Never Smoker   . Smokeless tobacco: Never Used  . Alcohol Use: 0.0 oz/week    0 Standard drinks or equivalent per week     Comment: occasionally  . Drug Use: No  . Sexual Activity: Not on file   Other  Topics Concern  . Not on file   Social History Narrative    Ms. Amble's family history includes Bone cancer in her sister; Colon cancer (age of onset: 70) in her mother; Diabetes in her brother and maternal grandmother; Heart disease in her brother and father; Hypertension in her brother; Lung cancer in her brother and sister; Throat cancer in her brother. There is no history of Colon polyps, Liver disease, or Kidney disease.      Objective:    Filed Vitals:   02/28/15 1412  BP: 160/68  Pulse: 84    Physical Exam  well-developed elderly  white female in no acute distress, pleasant blood pressure 160/68 pulse 84 height 5 foot 3 weight 118. HEENT; nontraumatic normocephalic EOMI PERRLA sclera anicteric, Cardiovascular; regular rate and rhythm with S1-S2 no murmur or gallop, Pulmonary; clear bilaterally, Abdomen ;soft, bowel sounds are active there is no palpable mass or hepatosplenomegaly she has mild tenderness in the right mid quadrant no guarding or rebound, Rectal ;rectal exam not done, Extremities; no clubbing cyanosis or edema skin warm and dry, Neuropsych;mood and affect appropriate       Assessment & Plan:   #86 81 year old white female with several month history of burning upper abdominal discomfort. Workup negative with the exception of mild gastritis on recent EGD. Her symptoms have not responded to Protonix and Zantac. Symptoms may be secondary to gastritis, also may be more IBS/functional dyspepsia #2 diverticulosis #3 remote history of adenomatous polyps #4 status post cholecystectomy and appendectomy  Plan; stay off Zantac, patient had already stopped Continue Protonix 40 mg by mouth every morning Add trial of Carafate 1 g 3 times daily between meals, she will try this for 2-3 weeks and if no improvement in her symptoms have asked her to call back. We could consider a probiotic and trial of an anti-spasmodic to treat  for IBS type symptoms. Another consideration would be colonoscopy. She did have colon and 2013 which was negative.   Denice Cardon Genia Harold PA-C 02/28/2015   Cc: Aletha Halim., PA-C

## 2015-02-28 NOTE — Patient Instructions (Addendum)
Continue the Protonix 40 mg, 1 tab every morning.  We sent a prescription to [facility] [place] [place] for Carafate 1 gram  3 times daily between meals. It is OK to take Miralax every other day.  Call back in 3 weeks if symptoms persist. Ask to speak to Amy's nurse.

## 2015-03-01 ENCOUNTER — Other Ambulatory Visit: Payer: Self-pay | Admitting: *Deleted

## 2015-03-01 MED ORDER — PANTOPRAZOLE SODIUM 40 MG PO TBEC: 40.0000 mg | DELAYED_RELEASE_TABLET | Freq: Every day | ORAL | Status: DC

## 2015-03-01 NOTE — Progress Notes (Signed)
i agree with the above note, plan 

## 2015-03-08 ENCOUNTER — Telehealth: Payer: Self-pay | Admitting: Physician Assistant

## 2015-03-08 ENCOUNTER — Other Ambulatory Visit: Payer: Self-pay

## 2015-03-08 DIAGNOSIS — R1032 Left lower quadrant pain: Secondary | ICD-10-CM

## 2015-03-08 NOTE — Telephone Encounter (Signed)
Call patient again. She now states she does have some burning in her stomach that she can improve by eating a few crackers. She questions why she was not changed from Protonix. Also states otc Prilosec and Nexium do not help her. She then changes her focus back to "why do I not have urge to move my bowels?"  I am somewhat confused on what she is wanting addressed and asked her directly did she want to ask about changing her PPI. I did advise her that she can have a celiac test done. She asks if Dr Ardis Hughs agrees with all of this and again asks why should she not have an urge to have a bowel movement.

## 2015-03-08 NOTE — Telephone Encounter (Signed)
Glad she is better- carafate can be somewhat constipating..would continue Miralax etc... Ok to order celiac panel  To r/o celiac

## 2015-03-08 NOTE — Telephone Encounter (Signed)
Her gastric issues have improved. She is on Carafate and Protonix. Her bowels are not moving as they should. She does not have an urge to go, but can can if she tries. She manages to have a daily bowel movement. Bowel movements are semi-formed. She is taking Miralax, a stool softener, drinking prune juice and green tea. She has recently started eating Activia. She feels that this is what has prevented her from have hard bowel movements. She questions celiac disease. Please advise.

## 2015-03-09 NOTE — Telephone Encounter (Signed)
We will have to ask Dr Ardis Hughs if he wants to double book.

## 2015-03-09 NOTE — Telephone Encounter (Signed)
Get her a follow up with Dr Ardis Hughs.Marland KitchenMarland Kitchen

## 2015-03-09 NOTE — Telephone Encounter (Signed)
Patty, I need your help. I do not see any openings until November.

## 2015-03-10 NOTE — Telephone Encounter (Signed)
I do not want to double book for this but please put her on wait list and offer her first available appt if someone cancels.  Thanks

## 2015-03-14 NOTE — Telephone Encounter (Signed)
Pt was offered appt for 03/16/15 and declines, she states she is feeling much better and will call if things change.

## 2015-03-16 ENCOUNTER — Ambulatory Visit: Payer: Medicare HMO | Admitting: Gastroenterology

## 2015-03-30 ENCOUNTER — Other Ambulatory Visit: Payer: Self-pay

## 2015-03-30 ENCOUNTER — Telehealth: Payer: Self-pay | Admitting: Physician Assistant

## 2015-03-30 MED ORDER — DEXLANSOPRAZOLE 60 MG PO CPDR: 60.0000 mg | DELAYED_RELEASE_CAPSULE | Freq: Every day | ORAL | Status: DC

## 2015-03-30 MED ORDER — ESOMEPRAZOLE MAGNESIUM 40 MG PO PACK: 40.0000 mg | PACK | Freq: Every day | ORAL | Status: DC

## 2015-03-30 NOTE — Telephone Encounter (Signed)
After labs, CT, EGD I still am not sure what is causing her symptoms.  Happy to change her protonix to another PPI, lets try nexium 40mg  pill one pill once daily. Best taken 20-30 min prior to BF meal.  Disp 30, refills 11.  I don't think the carafate needs to be continued, doesn't seem to be helping anyway.  She needs rov with me next available and also put on wait list so if I have a cancellation sooner she will be offered that appt.

## 2015-03-30 NOTE — Telephone Encounter (Signed)
She recognized Nexium. She states she has tried this as well as Omeprazole. They both increased her stomach pain. Can we try Dexilant? I have her scheduled with you and on the wait list. Thank you

## 2015-03-30 NOTE — Telephone Encounter (Signed)
dexilant is a great idea.  60mg  pill once daily, disp 30 with 11 refills  Thanks

## 2015-03-30 NOTE — Telephone Encounter (Signed)
No answer. No voicemail. [facility]. [facility]

## 2015-03-30 NOTE — Telephone Encounter (Signed)
The patient continues to have times when her "stomach really hurts". She asks if the Protonix should be changed. Does not want Aciphex due to the cost. She is also uncertain if she should refill Carafate because her pharmacist told her it is only to be taken for no more than 8 weeks. Please advise.

## 2015-03-31 ENCOUNTER — Telehealth: Payer: Self-pay | Admitting: Gastroenterology

## 2015-03-31 NOTE — Telephone Encounter (Signed)
Patient notified and verbalized understanding that rx for Dexilant [facility]. Patient to call back with any question or concerns.

## 2015-03-31 NOTE — Telephone Encounter (Signed)
PA was done for Dexilant but it is still going to be around 200.00 a month. Patient is requesting if she can try Omeprazole? Please advise

## 2015-03-31 NOTE — Telephone Encounter (Signed)
Spoke to patient insurance company and PA was done for [employer]. Dexilant was approved until 06-16-2016. Patient notified and called patient pharmacy to have them run the prescription through.

## 2015-04-01 NOTE — Telephone Encounter (Signed)
Ok, omeprazole 40mg  pills, one pill twice daily, disp 60, refills 11. She should take one pill 20-30 min before bf and dinner meals.  Thanks

## 2015-04-03 MED ORDER — OMEPRAZOLE 40 MG PO CPDR: 40.0000 mg | DELAYED_RELEASE_CAPSULE | Freq: Two times a day (BID) | ORAL | Status: DC

## 2015-04-03 NOTE — Telephone Encounter (Signed)
rx sent to patient pharmacy

## 2015-04-03 NOTE — Addendum Note (Signed)
Addended by: Gerlean Ren on: 04/03/2015 08:49 AM   Modules accepted: Orders, Medications

## 2015-04-16 ENCOUNTER — Encounter (HOSPITAL_COMMUNITY): Payer: Self-pay | Admitting: *Deleted

## 2015-04-16 ENCOUNTER — Emergency Department (HOSPITAL_COMMUNITY)
Admission: EM | Admit: 2015-04-16 | Discharge: 2015-04-16 | Disposition: A | Discharge status: Home / Self Care | Payer: Medicare HMO | Attending: Emergency Medicine | Admitting: Emergency Medicine

## 2015-04-16 ENCOUNTER — Emergency Department (HOSPITAL_COMMUNITY): Payer: Medicare HMO

## 2015-04-16 DIAGNOSIS — J90 Pleural effusion, not elsewhere classified: Secondary | ICD-10-CM | POA: Diagnosis not present

## 2015-04-16 DIAGNOSIS — Y998 Other external cause status: Secondary | ICD-10-CM | POA: Diagnosis not present

## 2015-04-16 DIAGNOSIS — W01198A Fall on same level from slipping, tripping and stumbling with subsequent striking against other object, initial encounter: Secondary | ICD-10-CM | POA: Insufficient documentation

## 2015-04-16 DIAGNOSIS — S20219A Contusion of unspecified front wall of thorax, initial encounter: Secondary | ICD-10-CM | POA: Insufficient documentation

## 2015-04-16 DIAGNOSIS — Z79899 Other long term (current) drug therapy: Secondary | ICD-10-CM | POA: Insufficient documentation

## 2015-04-16 DIAGNOSIS — Y9289 Other specified places as the place of occurrence of the external cause: Secondary | ICD-10-CM | POA: Diagnosis not present

## 2015-04-16 DIAGNOSIS — K219 Gastro-esophageal reflux disease without esophagitis: Secondary | ICD-10-CM | POA: Diagnosis not present

## 2015-04-16 DIAGNOSIS — Z85828 Personal history of other malignant neoplasm of skin: Secondary | ICD-10-CM | POA: Insufficient documentation

## 2015-04-16 DIAGNOSIS — Z86010 Personal history of colonic polyps: Secondary | ICD-10-CM | POA: Diagnosis not present

## 2015-04-16 DIAGNOSIS — Y9389 Activity, other specified: Secondary | ICD-10-CM | POA: Insufficient documentation

## 2015-04-16 DIAGNOSIS — S2232XA Fracture of one rib, left side, initial encounter for closed fracture: Secondary | ICD-10-CM | POA: Insufficient documentation

## 2015-04-16 DIAGNOSIS — R0602 Shortness of breath: Secondary | ICD-10-CM | POA: Diagnosis present

## 2015-04-16 DIAGNOSIS — M1990 Unspecified osteoarthritis, unspecified site: Secondary | ICD-10-CM | POA: Insufficient documentation

## 2015-04-16 LAB — BASIC METABOLIC PANEL
Anion gap: 11 (ref 5–15)
BUN: 7 mg/dL (ref 6–20)
CALCIUM: 9.2 mg/dL (ref 8.9–10.3)
CO2: 28 mmol/L (ref 22–32)
CREATININE: 0.6 mg/dL (ref 0.44–1.00)
Chloride: 96 mmol/L — ABNORMAL LOW (ref 101–111)
GFR calc Af Amer: >60 mL/min (ref >60)
GFR calc non Af Amer: >60 mL/min (ref >60)
GLUCOSE: 106 mg/dL — AB (ref 65–99)
Potassium: 4.2 mmol/L (ref 3.5–5.1)
Sodium: 135 mmol/L (ref 135–145)

## 2015-04-16 LAB — CBC
HCT: 37.6 % (ref 36.0–46.0)
Hemoglobin: 12.7 g/dL (ref 12.0–15.0)
MCH: 31.4 pg (ref 26.0–34.0)
MCHC: 33.8 g/dL (ref 30.0–36.0)
MCV: 92.8 fL (ref 78.0–100.0)
PLATELETS: 291 10*3/uL (ref 150–400)
RBC: 4.05 MIL/uL (ref 3.87–5.11)
RDW: 12.3 % (ref 11.5–15.5)
WBC: 5.9 10*3/uL (ref 4.0–10.5)

## 2015-04-16 LAB — TROPONIN I: Troponin I: <0.03 ng/mL (ref <0.031)

## 2015-04-16 NOTE — ED Notes (Signed)
Pt states fell and injured L ribs last Sat.  Mon she was seen by pcp and he stated x-ray did not show any fractures.  Since then pt is becoming increasingly sob. O2 sats 99%.

## 2015-04-16 NOTE — Discharge Instructions (Signed)
Rib Fracture °A rib fracture is a break or crack in one of the bones of the ribs. The ribs are a group of long, curved bones that wrap around your chest and attach to your spine. They protect your lungs and other organs in the chest cavity. A broken or cracked rib is often painful, but most do not cause other problems. Most rib fractures heal on their own over time. However, rib fractures can be more serious if multiple ribs are broken or if broken ribs move out of place and push against other structures. °CAUSES  °· A direct blow to the chest. For example, this could happen during contact sports, a car accident, or a fall against a hard object. °· Repetitive movements with high force, such as pitching a baseball or having severe coughing spells. °SYMPTOMS  °· Pain when you breathe in or cough. °· Pain when someone presses on the injured area. °DIAGNOSIS  °Your caregiver will perform a physical exam. Various imaging tests may be ordered to confirm the diagnosis and to look for related injuries. These tests may include a chest X-ray, computed tomography (CT), magnetic resonance imaging (MRI), or a bone scan. °TREATMENT  °Rib fractures usually heal on their own in 1-3 months. The longer healing period is often associated with a continued cough or other aggravating activities. During the healing period, pain control is very important. Medication is usually given to control pain. Hospitalization or surgery may be needed for more severe injuries, such as those in which multiple ribs are broken or the ribs have moved out of place.  °HOME CARE INSTRUCTIONS  °· Avoid strenuous activity and any activities or movements that cause pain. Be careful during activities and avoid bumping the injured rib. °· Gradually increase activity as directed by your caregiver. °· Only take over-the-counter or prescription medications as directed by your caregiver. Do not take other medications without asking your caregiver first. °· Apply ice  to the injured area for the first 1-2 days after you have been treated or as directed by your caregiver. Applying ice helps to reduce inflammation and pain. °· Put ice in a plastic bag. °· Place a towel between your skin and the bag.   °· Leave the ice on for 15-20 minutes at a time, every 2 hours while you are awake. °· Perform deep breathing as directed by your caregiver. This will help prevent pneumonia, which is a common complication of a broken rib. Your caregiver may instruct you to: °· Take deep breaths several times a day. °· Try to cough several times a day, holding a pillow against the injured area. °· Use a device called an incentive spirometer to practice deep breathing several times a day. °· Drink enough fluids to keep your urine clear or pale yellow. This will help you avoid constipation.   °· Do not wear a rib belt or binder. These restrict breathing, which can lead to pneumonia.   °SEEK IMMEDIATE MEDICAL CARE IF:  °· You have a fever.   °· You have difficulty breathing or shortness of breath.   °· You develop a continual cough, or you cough up thick or bloody sputum. °· You feel sick to your stomach (nausea), throw up (vomit), or have abdominal pain.   °· You have worsening pain not controlled with medications.   °MAKE SURE YOU: °· Understand these instructions. °· Will watch your condition. °· Will get help right away if you are not doing well or get worse. °  °This information is not intended to   replace advice given to you by your health care provider. Make sure you discuss any questions you have with your health care provider.   Document Released: 06/03/2005 Document Revised: 02/03/2013 Document Reviewed: 08/05/2012 Elsevier Interactive Patient Education 2016 [employer] Rib Belt Rib belts can be used to help control the pain from a chest injury. The belt gives gentle support to the injured area. It also helps reduce chest motion.  RISKS AND COMPLICATIONS Rib belts can increase the risk  of getting pneumonia after a chest injury. You must be sure to breathe deeply and cough several times every hour to keep your lungs clear. Do not use a rib belt if it does not help relieve your pain.   HOW TO APPLY THE RIB BELT Wear the rib belt only as directed by your health care provider. Take the belt off at night when you go to bed.  Place the belt across your back and stretch the ends out and forward across your rib cage.  Press the velcro areas together when the belt is putting gentle pressure on your chest wall. Do not make the rib belt too tight. Make sure you can breathe comfortably. SEEK MEDICAL CARE IF: You have a fever. SEEK IMMEDIATE MEDICAL CARE IF:  You develop pus-like sputum or an uncontrolled cough.  You begin coughing up blood.  You have pain that is getting worse or is not controlled with medicines. MAKE SURE YOU:  Understand these instructions.  Will watch your condition.  Will get help right away if you are not doing well or get worse.   This information is not intended to replace advice given to you by your health care provider. Make sure you discuss any questions you have with your health care provider.   Document Released: 07/11/2004 Document Revised: 06/24/2014 Document Reviewed: 10/19/2013 Elsevier Interactive Patient Education 2016 [place] An incentive spirometer is a tool that can help keep your lungs clear and active. This tool measures how well you are filling your lungs with each breath. Taking long, deep breaths may help reverse or decrease the chance of developing breathing (pulmonary) problems (especially infection) following:  Surgery of the chest or abdomen.  Surgery if you have a history of smoking or a lung problem.  A long period of time when you are unable to move or be active. BEFORE THE PROCEDURE   If the spirometer includes an indicator to show your best effort, your nurse or respiratory therapist will set  it to a desired goal.  If possible, sit up straight or lean slightly forward. Try not to slouch.  Hold the incentive spirometer in an upright position. INSTRUCTIONS FOR USE   Sit on the edge of your bed if possible, or sit up as far as you can in bed or on a chair.  Hold the incentive spirometer in an upright position.  Breathe out normally.  Place the mouthpiece in your mouth and seal your lips tightly around it.  Breathe in slowly and as deeply as possible, raising the piston or the ball toward the top of the column.  Hold your breath for 3-5 seconds or for as long as possible. Allow the piston or ball to fall to the bottom of the column.  Remove the mouthpiece from your mouth and breathe out normally.  Rest for a few seconds and repeat Steps 1 through 7 at least 10 times every 1-2 hours when you are awake. Take your time and take a few normal  breaths between deep breaths.  The spirometer may include an indicator to show your best effort. Use the indicator as a goal to work toward during each repetition.  After each set of 10 deep breaths, practice coughing to be sure your lungs are clear. If you have an incision (the cut made at the time of surgery), support your incision when coughing by placing a pillow or rolled-up towels firmly against it. Once you are able to get out of bed, walk around indoors and cough well. You may stop using the incentive spirometer when instructed by your caregiver.  RISKS AND COMPLICATIONS  Breathing too quickly may cause dizziness. At an extreme, this could cause you to pass out. Take your time so you do not get dizzy or light-headed.  If you are in pain, you may need to take or ask for pain medication before doing incentive spirometry. It is harder to take a deep breath if you are having pain. AFTER USE  Rest and breathe slowly and easily.  It can be helpful to keep a log of your progress. Your caregiver can provide you with a simple table to help  with this. If you are using the spirometer at home, follow these instructions: [place] IF:   You are having difficultly using the spirometer.  You have trouble using the spirometer as often as instructed.  Your pain medication is not giving enough relief while using the spirometer.  You develop fever of 100.34F (38.1C) or higher. SEEK IMMEDIATE MEDICAL CARE IF:   You cough up bloody sputum that had not been present before.  You develop fever of 102F (38.9C) or greater.  You develop worsening pain at or near the incision site. MAKE SURE YOU:   Understand these instructions.  Will watch your condition.  Will get help right away if you are not doing well or get worse.   This information is not intended to replace advice given to you by your health care provider. Make sure you discuss any questions you have with your health care provider.   Document Released: 10/14/2006 Document Revised: 06/24/2014 Document Reviewed: 01/10/2014 Elsevier Interactive Patient Education 2016 Elsevier Inc. Pleural Effusion A pleural effusion is an abnormal buildup of fluid in the layers of tissue between your lungs and the inside of your chest (pleural space). These two layers of tissue that line both your lungs and the inside of your chest are called pleura. Usually, there is no air in the space between the pleura, only a thin layer of fluid. If left untreated, a large amount of fluid can build up and cause the lung to collapse. A pleural effusion is usually caused by another disease that requires treatment. The two main types of pleural effusion are:  Transudative pleural effusion. This happens when fluid leaks into the pleural space because of a low protein count in your blood or high blood pressure in your vessels. Heart failure often causes this.  Exudative infusion. This occurs when fluid collects in the pleural space from blocked blood vessels or lymph vessels. Some lung diseases,  injuries, and cancers can cause this type of effusion. CAUSES Pleural effusion can be caused by:  Heart failure.  A blood clot in the lung (pulmonary embolism).  Pneumonia.  Cancer.  Liver failure (cirrhosis).  Kidney disease.  Complications from surgery, such as from open heart surgery. SIGNS AND SYMPTOMS In some cases, pleural effusion may cause no symptoms. Symptoms can include:  Shortness of breath, especially when lying  down.  Chest pain, often worse when taking a deep breath.  Fever.  Dry cough that is lasting (chronic).  Hiccups.  Rapid breathing. An underlying condition that is causing the pleural effusion (such as heart failure, pneumonia, blood clots, tuberculosis, or cancer) may also cause additional symptoms. DIAGNOSIS Your health care provider may suspect pleural effusion based on your symptoms and medical history. Your health care provider will also do a physical exam and a chest X-ray. If the X-ray shows there is fluid in your chest, you may need to have this fluid removed using a needle (thoracentesis) so it can be tested. You may also have:  Imaging studies of the chest, such as:  Ultrasound.  CT scan.  Blood tests for kidney and liver function. TREATMENT Treatment depends on the cause of the pleural effusion. Treatment may include:  Taking antibiotic medicines to clear up an infection that is causing the pleural effusion.  Placing a tube in the chest to drain the effusion (tube thoracostomy). This procedure is often used when there is an infection in the fluid.  Surgery to remove the fibrous outer layer of tissue from the pleural space (decortication).  Thoracentesis, which can improve cough and shortness of breath.  A procedure to put medicine into the chest cavity to seal the pleural space to prevent fluid buildup (pleurodesis).  Chemotherapy and radiation therapy. These may be required in the case of cancerous (malignant) pleural  effusion. HOME CARE INSTRUCTIONS  Take medicines only as directed by your health care provider.  Keep track of how long you can gently exercise before you get short of breath. Try simply walking at first.  Do not use any tobacco products, including cigarettes, chewing tobacco, or electronic cigarettes. If you need help quitting, ask your health care provider.  Keep all follow-up visits as directed by your health care provider. This is important. SEEK MEDICAL CARE IF:  The amount of time that you are able to exercise decreases or does not improve with time.  You have pain or signs of infection at the puncture site if you had thoracentesis. Watch for:  Drainage.  Redness.  Swelling.  You have a fever. SEEK IMMEDIATE MEDICAL CARE IF:  You are short of breath.  You develop chest pain.  You develop a new cough. MAKE SURE YOU:  Understand these instructions.  Will watch your condition.  Will get help right away if you are not doing well or get worse.   This information is not intended to replace advice given to you by your health care provider. Make sure you discuss any questions you have with your health care provider.   Document Released: 06/03/2005 Document Revised: 06/24/2014 Document Reviewed: 10/27/2013 Elsevier Interactive Patient Education [employer].

## 2015-04-16 NOTE — ED Provider Notes (Signed)
CSN: [id number]     Arrival date & time 04/16/15  0962 History   First MD Initiated Contact with Patient 04/16/15 1105     Chief Complaint  Patient presents with  . Shortness of Breath   Madison [name] is a 81 y.o. female who presents to the ED complaining of feeling short of breath after she feel into a chair one week ago. Patient reports that one week ago she tripped and fell onto the back of a chair. She reports that since then she's had pain and her left ribs and feeling still short of breath. She reports she's been doing increasingly fatigued recently. She reports that her pain radiates between her shoulder blades. She has taken nothing for treatment today. She reports having a dry cough. She reports she was seen by her primary care provider Dr. Redmond Pulling last week who performed a chest x-ray which she reports was unremarkable. She denies fevers, chills, chest pain, palpitations, urinary symptoms, abdominal pain, nausea, vomiting, syncope, lightheadedness or dizziness.  (Consider location/radiation/quality/duration/timing/severity/associated sxs/prior Treatment) HPI  Past Medical History  Diagnosis Date  . Hiatal hernia   . GERD (gastroesophageal reflux disease)   . IBS (irritable bowel syndrome)   . Osteopenia   . Cancer, skin, squamous cell   . Restless leg syndrome   . Frozen shoulder     left   . Bladder infection   . Arthritis   . Colon polyps   . Gallstones    Past Surgical History  Procedure Laterality Date  . Cholecystectomy    . Appendectomy    . Cesarean section     Family History  Problem Relation Age of Onset  . Colon cancer Mother 16  . Heart disease Father     MI  . Heart disease Brother   . Lung cancer Brother     x3  . Diabetes Brother   . Hypertension Brother   . Bone cancer Sister   . Lung cancer Sister     remission  . Colon polyps Neg Hx   . Throat cancer Brother   . Liver disease Neg Hx   . Kidney disease Neg Hx   . Diabetes Maternal  Grandmother    Social History  Substance Use Topics  . Smoking status: Never Smoker   . Smokeless tobacco: Never Used  . Alcohol Use: 0.0 oz/week    0 Standard drinks or equivalent per week     Comment: occasionally   OB History    No data available     Review of Systems  Constitutional: Negative for fever and chills.  HENT: Negative for congestion and sore throat.   Eyes: Negative for visual disturbance.  Respiratory: Positive for cough, chest tightness and shortness of breath.   Cardiovascular: Negative for chest pain and palpitations.  Gastrointestinal: Negative for nausea, vomiting and abdominal pain.  Genitourinary: Negative for dysuria.  Musculoskeletal: Negative for back pain and neck pain.  Skin: Positive for color change. Negative for rash.  Neurological: Negative for dizziness, syncope, weakness, light-headedness and headaches.      Allergies  Review of patient's allergies indicates no known allergies.  Home Medications   Prior to Admission medications   Medication Sig Start Date End Date Taking? Authorizing Provider  ALPRAZolam (XANAX) 0.25 MG tablet Take 0.25 mg by mouth at bedtime as needed.    Historical Provider, MD  Calcium-Vitamin D-Vitamin K (VIACTIV) [phone] MG-UNT-MCG CHEW Chew 2 each by mouth daily.      Historical Provider,  MD  cetirizine (ZYRTEC) 10 MG tablet Take 5 mg by mouth daily.    Historical Provider, MD  omeprazole (PRILOSEC) 40 MG capsule Take 1 capsule (40 mg total) by mouth 2 (two) times daily. Take 20-30 prior to breakfast and dinner. 04/03/15   Milus Banister, MD  RaNITidine HCl ([facility] RANITIDINE PO) Take by mouth at bedtime as needed.    Historical Provider, MD   BP 158/73 mmHg  Pulse 74  Temp(Src) 98.2 F (36.8 C) (Oral)  Resp 16  Ht 5' 3.5" (1.613 m)  Wt 113 lb (51.256 kg)  BMI 19.70 kg/m2  SpO2 100% Physical Exam  Constitutional: She is oriented to person, place, and time. She appears well-developed and well-nourished. No  distress.  Nontoxic appearing.  HENT:  Head: Normocephalic and atraumatic.  Right Ear: External ear normal.  Left Ear: External ear normal.  Mouth/Throat: Oropharynx is clear and moist.  Eyes: Conjunctivae are normal. Pupils are equal, round, and reactive to light. Right eye exhibits no discharge. Left eye exhibits no discharge.  Neck: Normal range of motion. Neck supple. No JVD present. No tracheal deviation present.  No midline neck tenderness.  Cardiovascular: Normal rate, regular rhythm, normal heart sounds and intact distal pulses.  Exam reveals no gallop and no friction rub.   No murmur heard. Bilateral radial, posterior tibialis and dorsalis pedis pulses are intact.    Pulmonary/Chest: Effort normal and breath sounds normal. No respiratory distress. She has no wheezes. She has no rales. She exhibits tenderness.  Lungs are clear to auscultation bilaterally. Ecchymosis noted to her left lateral chest wall. Tenderness over her left lateral chest wall. No crepitus. Symmetric chest expansion bilaterally. No respiratory distress. Patient speaking in complete sentences.  Abdominal: Soft. Bowel sounds are normal. She exhibits no distension. There is no tenderness. There is no guarding.  Abdomen is soft and nontender to palpation.  Musculoskeletal: She exhibits no edema.  No midline neck or back tenderness. Patient is spontaneously moving all extremities in a coordinated fashion exhibiting good strength.   Lymphadenopathy:    She has no cervical adenopathy.  Neurological: She is alert and oriented to person, place, and time. Coordination normal.  Skin: Skin is warm and dry. No rash noted. She is not diaphoretic. No erythema. No pallor.  Psychiatric: She has a normal mood and affect. Her behavior is normal.  Nursing note and vitals reviewed.   ED Course  Procedures (including critical care time) Labs Review Labs Reviewed  BASIC METABOLIC PANEL - Abnormal; Notable for the following:     Chloride 96 (*)    Glucose, Bld 106 (*)    All other components within normal limits  TROPONIN I  CBC    Imaging Review Dg Chest 2 View  04/16/2015  CLINICAL DATA:  Fall, injured left ribs last Saturday. Increasingly short of breath. EXAM: CHEST  2 VIEW COMPARISON:  Chest x-ray dated 04/10/2015. FINDINGS: Cardiomediastinal silhouette remains normal in size and configuration. Airspace opacity at the left lung base is grossly stable, with adjacent small left pleural effusion which also appears stable. Right lung remains clear. No pneumothorax seen. No osseous fracture or dislocation seen. Mild degenerative change noted throughout the thoracic spine. IMPRESSION: 1. Stable chest x-ray. Ill-defined airspace opacity at the left lung base is again noted, again compatible with either atelectasis, pneumonia, or pulmonary contusion. Small adjacent left pleural effusion also appears stable. 2. No new lung findings seen.  No pneumothorax. 3. No rib fracture seen. Electronically Signed  By: Franki Cabot M.D.   On: 04/16/2015 10:39   Ct Chest Wo Contrast  04/16/2015  CLINICAL DATA:  [place] with bruising to the LEFT chest. Fall last night. Persistent pain. EXAM: CT CHEST WITHOUT CONTRAST TECHNIQUE: Multidetector CT imaging of the chest was performed following the standard protocol without IV contrast. COMPARISON:  CT chest 04/15/2013 FINDINGS: Mediastinum/Nodes: No axillary supraclavicular adenopathy. No mediastinal hilar adenopathy. No evidence of mediastinal hematoma. The aorta is normal. No pericardial fluid. Abnormality esophagus a central airway. Lungs/Pleura: There is a small LEFT pleural effusion. Adjacent minimally displaced LEFT eleventh and tenth rib fractures. No pneumothorax. No pulmonary contusion. Upper abdomen: Low-density lesion in the RIGHT hepatic lobe measures 15 mm on image 56, series 2. this lesion is characterized benign hemangioma on CT of 02/16/2015. Normal adrenal glands. Musculoskeletal:  Posterior LEFT eleventh and tenth rib fractures (image 44, series 2 and image 50 series 2. IMPRESSION: 1. Minimally displaced posterior LEFT tenth and eleventh post rib fractures. 2. Small associated LEFT pleural effusion. No pulmonary contusion or pneumothorax. Electronically Signed   By: Suzy Bouchard M.D.   On: 04/16/2015 13:24   I have personally reviewed and evaluated these images and lab results as part of my medical decision-making.   EKG Interpretation   Date/Time:  Sunday April 16 2015 09:35:30 EDT Ventricular Rate:  76 PR Interval:  160 QRS Duration: 80 QT Interval:  368 QTC Calculation: 414 R Axis:   61 Text Interpretation:  Normal sinus rhythm Normal ECG No significant change  since last tracing Confirmed by Mingo Amber  MD, BLAIR ([id number]) on 04/16/2015  11:11:33 AM      Filed Vitals:   04/16/15 1315 04/16/15 1330 04/16/15 1345 04/16/15 1400  BP: 130/51 134/58 136/54 158/73  Pulse: 65 64 72 74  Temp:      TempSrc:      Resp: 15 12 20 16   Height:      Weight:      SpO2: 100% 100% 100% 100%     MDM   Meds given in ED:  Medications - No data to display  New Prescriptions   No medications on file    Final diagnoses:  Rib fractures, left, closed, initial encounter  Pleural effusion on left   This is a 81 y.o. female who presents to the ED complaining of feeling short of breath after she feel into a chair one week ago. Patient reports that one week ago she tripped and fell onto the back of a chair. She reports that since then she's had pain and her left ribs and feeling still short of breath.  The patient was seen by her primary care provider Dr. Redmond Pulling on 04/10/2015 who performed a chest x-ray which she reports was unremarkable and indicated no fractures. The patient denies syncope, chest pain, palpitations, lightheadedness or dizziness. On exam the patient is afebrile and nontoxic appearing. She appears younger than her stated age. There is ecchymosis noted to her  left lateral chest wall with tenderness to palpation. No crepitus. Symmetric chest rise and fall. Lungs clear to auscultation bilaterally. Her oxygen saturations 100% on room air. EKG showed no skin change from her last tracing. BNP, troponin and CBC are unremarkable. Chest x-ray today is stable and again notes an ill-defined airspace opacity in the left lung base. Will obtain CT chest without contrast for further evaluation. CT chest without contrast indicates a minimally displaced posterior left 10th and 11th rib fractures. There is also a small associated left pleural  effusion with no pulmonary contusion or pneumothorax. On reevaluation the patient is resting comfortably in bed. Her oxygen saturations 100% on room air. Will provide the patient with rib belt and incentive spirometer. Patient was inflated in the emergency department without hypoxia. I explained test results and the patient is comfortable with discharge. Patient does not wish for any prescription pain medicines. Advised to take Tylenol as needed for her pain at home. I discussed strict return precautions. I advised the patient to follow-up with their primary care provider this week. I advised the patient to return to the emergency department with new or worsening symptoms or new concerns. The patient verbalized understanding and agreement with plan.    This patient was discussed with and evaluated by Dr. Mingo Amber who agrees with assessment and plan.     Waynetta Pean, PA-C 04/16/15 [place], MD 04/17/15 [phone]

## 2015-04-16 NOTE — ED Notes (Signed)
Pt ambulated independlty with SpO2 remaining at 99% and above. NAD noted.

## 2015-05-08 ENCOUNTER — Telehealth: Payer: Self-pay | Admitting: Gastroenterology

## 2015-05-08 NOTE — Telephone Encounter (Signed)
Pt states she is having some burning and pain in her stomach. Pt states she has been taking protonix. She was sent in a prescription in October for Prilosec 40mg  bid but has not picked it up yet. Pt will pick this up and try taking it to see if this helps.

## 2015-06-02 ENCOUNTER — Ambulatory Visit (INDEPENDENT_AMBULATORY_CARE_PROVIDER_SITE_OTHER): Payer: Medicare HMO | Admitting: Gastroenterology

## 2015-06-02 ENCOUNTER — Encounter: Payer: Self-pay | Admitting: Gastroenterology

## 2015-06-02 VITALS — BP 100/60 | HR 74 | Ht 63.5 in | Wt 118.0 lb

## 2015-06-02 DIAGNOSIS — R1013 Epigastric pain: Secondary | ICD-10-CM | POA: Diagnosis not present

## 2015-06-02 NOTE — Patient Instructions (Signed)
Continue taking omeprazole before BF meal daily. Call if any new problems.

## 2015-06-02 NOTE — Progress Notes (Signed)
Review of pertinent gastrointestinal problems:  1. Family history for colon cancer, personal history of tubular adenomas. Last colonoscopy November 2007 by Dr. Lyla Son, tortuous although normal. Next colonoscopy November 2012. Was scheduled for colonoscopy 2012 but she had difficulty with the prep (moviprep) and she canceled it. 10/2011 Colonoscopy Dr. Ardis Hughs with different prep; normal except small hemorrhoids. Recommended no further CRC screening given age. 2. Chronic gastroesophageal reflux disease. Has been on a variety of anti-acid, on a variety of PPIs with mixed results. esophagogastroduodenoscopy January 2005 by Dr. Lyla Son. No esophagitis but a Schatzki's ring was seen. Hyperplastic polyps in the gastric body. EGD February 2008 by Dr. Ardis Hughs was normal except for previously sampled hyperplastic-appearing gastric polyps.  3. Chronic dyspepsia, somewhat GERD like.  Repeat EGD Dr. Ardis Hughs 12/2014 found mild gastritis, h. Pylori negative. Follow up CT scan 12/2014 was unrevealing.  Improved on omeprazole.  HPI: This is a   very pleasant 81 year old woman whom I last saw about 6 months ago.  Chief complaint is GERD-like dyspepsia   Was on protonix, then tried dexilant but her insurance co doesn't like it.  Then omeprazole twice daily, has tapered to once daily 20 min prior to AM meal.  Weight about 3 pounds down from 7 months ago (same scale here in GI).  Feels well on once dialy omeprazole.      Past Medical History  Diagnosis Date  . Hiatal hernia   . GERD (gastroesophageal reflux disease)   . IBS (irritable bowel syndrome)   . Osteopenia   . Cancer, skin, squamous cell   . Restless leg syndrome   . Frozen shoulder     left   . Bladder infection   . Arthritis   . Colon polyps   . Gallstones     Past Surgical History  Procedure Laterality Date  . Cholecystectomy    . Appendectomy    . Cesarean section      Current Outpatient Prescriptions  Medication Sig  Dispense Refill  . ALPRAZolam (XANAX) 0.25 MG tablet Take 0.25 mg by mouth at bedtime as needed.    . Calcium-Vitamin D-Vitamin K (VIACTIV) [id number] MG-UNT-MCG CHEW Chew 2 each by mouth daily.      Marland Kitchen omeprazole (PRILOSEC) 40 MG capsule Take 40 mg by mouth daily.     No current facility-administered medications for this visit.    Allergies as of 06/02/2015  . (No Known Allergies)    Family History  Problem Relation Age of Onset  . Colon cancer Mother 64  . Heart disease Father     MI  . Heart disease Brother   . Lung cancer Brother     x3  . Diabetes Brother   . Hypertension Brother   . Bone cancer Sister   . Lung cancer Sister     remission  . Colon polyps Neg Hx   . Throat cancer Brother   . Liver disease Neg Hx   . Kidney disease Neg Hx   . Diabetes Maternal Grandmother     Social History   Social History  . Marital Status: Married    Spouse Name: N/A  . Number of Children: 2  . Years of Education: N/A   Occupational History  . retired    Social History Main Topics  . Smoking status: Never Smoker   . Smokeless tobacco: Never Used  . Alcohol Use: 0.0 oz/week    0 Standard drinks or equivalent per week     Comment: occasionally  .  Drug Use: No  . Sexual Activity: Not on file   Other Topics Concern  . Not on file   Social History Narrative     Physical Exam: BP 100/60 mmHg  Pulse 74  Ht 5' 3.5" (1.613 m)  Wt 118 lb (53.524 kg)  BMI 20.57 kg/m2 Constitutional: generally well-appearing Psychiatric: alert and oriented x3 Abdomen: soft, nontender, nondistended, no obvious ascites, no peritoneal signs, normal bowel sounds   Assessment and plan: 81 y.o. female with GERD-like dyspepsia  Her symptoms were pretty rocky over the summer and fall, there was some back and forth with different proton pump inhibitors. EGD and CT scan really showed no significant findings besides mild gastritis. Fortunately she is doing very well on omeprazole 40 mg strength  once daily taken 20-30 minutes before her breakfast meal. She has no alarm symptoms. She will continue on this regimen for now. She will follow-up on a when necessary basis.    Owens Loffler, MD [facility] 06/02/2015, 1:44 PM

## 2015-06-21 DIAGNOSIS — R300 Dysuria: Secondary | ICD-10-CM | POA: Diagnosis not present

## 2015-07-04 ENCOUNTER — Telehealth: Payer: Self-pay | Admitting: Gastroenterology

## 2015-07-04 NOTE — Telephone Encounter (Signed)
The pt is having to urinate frequently, she recently had a UTI and was treated.  She will call her PCP and have them evaluate her for recurring infection.  Forwarded to Dr Ardis Hughs for further review.

## 2015-07-04 NOTE — Telephone Encounter (Signed)
I agree she should discuss with her pcp

## 2015-07-13 DIAGNOSIS — R399 Unspecified symptoms and signs involving the genitourinary system: Secondary | ICD-10-CM | POA: Diagnosis not present

## 2015-07-31 DIAGNOSIS — D225 Melanocytic nevi of trunk: Secondary | ICD-10-CM | POA: Diagnosis not present

## 2015-07-31 DIAGNOSIS — D485 Neoplasm of uncertain behavior of skin: Secondary | ICD-10-CM | POA: Diagnosis not present

## 2015-07-31 DIAGNOSIS — D044 Carcinoma in situ of skin of scalp and neck: Secondary | ICD-10-CM | POA: Diagnosis not present

## 2015-07-31 DIAGNOSIS — Z23 Encounter for immunization: Secondary | ICD-10-CM | POA: Diagnosis not present

## 2015-07-31 DIAGNOSIS — L821 Other seborrheic keratosis: Secondary | ICD-10-CM | POA: Diagnosis not present

## 2015-07-31 DIAGNOSIS — L820 Inflamed seborrheic keratosis: Secondary | ICD-10-CM | POA: Diagnosis not present

## 2015-07-31 DIAGNOSIS — Z85828 Personal history of other malignant neoplasm of skin: Secondary | ICD-10-CM | POA: Diagnosis not present

## 2015-08-28 DIAGNOSIS — D044 Carcinoma in situ of skin of scalp and neck: Secondary | ICD-10-CM | POA: Diagnosis not present

## 2015-09-08 ENCOUNTER — Encounter: Payer: Self-pay | Admitting: Gastroenterology

## 2015-10-17 DIAGNOSIS — R5383 Other fatigue: Secondary | ICD-10-CM | POA: Insufficient documentation

## 2015-10-17 DIAGNOSIS — F419 Anxiety disorder, unspecified: Secondary | ICD-10-CM | POA: Insufficient documentation

## 2015-10-17 DIAGNOSIS — J310 Chronic rhinitis: Secondary | ICD-10-CM | POA: Insufficient documentation

## 2015-10-18 DIAGNOSIS — R0602 Shortness of breath: Secondary | ICD-10-CM | POA: Diagnosis not present

## 2015-10-18 DIAGNOSIS — R918 Other nonspecific abnormal finding of lung field: Secondary | ICD-10-CM | POA: Diagnosis not present

## 2015-11-04 DIAGNOSIS — R350 Frequency of micturition: Secondary | ICD-10-CM | POA: Diagnosis not present

## 2015-11-16 ENCOUNTER — Encounter: Payer: Self-pay | Admitting: Cardiovascular Disease

## 2015-11-16 ENCOUNTER — Ambulatory Visit (INDEPENDENT_AMBULATORY_CARE_PROVIDER_SITE_OTHER): Payer: PPO | Admitting: Cardiovascular Disease

## 2015-11-16 VITALS — BP 130/70 | HR 65 | Ht 63.0 in | Wt 119.6 lb

## 2015-11-16 DIAGNOSIS — R0789 Other chest pain: Secondary | ICD-10-CM

## 2015-11-16 DIAGNOSIS — R0602 Shortness of breath: Secondary | ICD-10-CM

## 2015-11-16 NOTE — Progress Notes (Signed)
Cardiology Office Note   Date:  11/16/2015   ID:  Madison [name], Madison [name] 1934-06-01, MRN [id number]  PCP:  Tula Nakayama  Cardiologist:   Skeet Latch, MD   Chief Complaint  Patient presents with  . New Evaluation    establish care for SOB--fell and broke 2 ribs in october; SOB is worse in the morning; occasional aching in jaw/ear on right side      History of Present Illness: Madison [name] is a 82 y.o. female with GERD who presents for an evaluation of shortness of breath.  [name] tripped and fell in October.  She broke two ribs.  She had a chest xray that revealed a small pleural effusion that was presumed to be blood.  [name] has noted progressive shortness of breath over the last several months. The episodes are worse in the morning or when she gets up to go to the bathroom late at night.  She started an exercise program and is able to complete her exercises without chest pain or shortness of breath. She does sometimes notices that she has a difficult time checking her pulse oximetry while at exercise. When she does get to work it reveals that her oxygen levels are in the 80s or low 90s. [name] denies lower extremity edema, orthopnea or PND.  She denies chest pain but does have discomfort that she attributes to acid reflux.  This occurs after eating certain foods.  She denies palpitations, lightheadedness, or dizziness.  Past Medical History  Diagnosis Date  . Hiatal hernia   . GERD (gastroesophageal reflux disease)   . IBS (irritable bowel syndrome)   . Osteopenia   . Cancer, skin, squamous cell   . Restless leg syndrome   . Frozen shoulder     left   . Bladder infection   . Arthritis   . Colon polyps   . Gallstones     Past Surgical History  Procedure Laterality Date  . Cholecystectomy    . Appendectomy    . Cesarean section       Current Outpatient Prescriptions  Medication Sig Dispense Refill  . ALPRAZolam (XANAX) 0.25 MG  tablet Take 0.25 mg by mouth at bedtime as needed.    . Calcium-Vitamin D-Vitamin K (VIACTIV) [id number] MG-UNT-MCG CHEW Chew 2 each by mouth daily.      Marland Kitchen omeprazole (PRILOSEC) 40 MG capsule Take 40 mg by mouth daily.     No current facility-administered medications for this visit.    Allergies:   Nitroglycerin    Social History:  The patient  reports that she has never smoked. She has never used smokeless tobacco. She reports that she drinks alcohol. She reports that she does not use illicit drugs.   Family History:  The patient's family history includes Bone cancer in her sister; Colon cancer (age of onset: 48) in her mother; Diabetes in her brother and maternal grandmother; Heart disease in her brother and father; Hypertension in her brother; Lung cancer in her brother and sister; Throat cancer in her brother. There is no history of Colon polyps, Liver disease, or Kidney disease.    ROS:  Please see the history of present illness.   Otherwise, review of systems are positive for none.   All other systems are reviewed and negative.    PHYSICAL EXAM: VS:  BP 130/70 mmHg  Pulse 65  Ht 5\' 3"  (1.6 m)  Wt 119 lb 9.6 oz (54.25  kg)  BMI 21.19 kg/m2 , BMI Body mass index is 21.19 kg/(m^2). GENERAL:  Well appearing HEENT:  Pupils equal round and reactive, fundi not visualized, oral mucosa unremarkable NECK:  No jugular venous distention, waveform within normal limits, carotid upstroke brisk and symmetric, no bruits, no thyromegaly LYMPHATICS:  No cervical adenopathy LUNGS:  Clear to auscultation bilaterally HEART:  RRR.  PMI not displaced or sustained,S1 and S2 within normal limits, no S3, no S4, no clicks, no rubs, no murmurs ABD:  Flat, positive bowel sounds normal in frequency in pitch, no bruits, no rebound, no guarding, no midline pulsatile mass, no hepatomegaly, no splenomegaly EXT:  2 plus pulses throughout, no edema, no cyanosis no clubbing SKIN:  No rashes no nodules NEURO:   Cranial nerves II through XII grossly intact, motor grossly intact throughout PSYCH:  Cognitively intact, oriented to person place and time  EKG:  EKG is ordered today. The ekg ordered today demonstrates sinus rhythm. Rate 65 bpm.  Recent Labs: 04/16/2015: BUN 7; Creatinine, Ser 0.60; Hemoglobin 12.7; Platelets 291; Potassium 4.2; Sodium 135    Lipid Panel No results found for: CHOL, TRIG, HDL, CHOLHDL, VLDL, LDLCALC, LDLDIRECT    Wt Readings from Last 3 Encounters:  11/16/15 119 lb 9.6 oz (54.25 kg)  06/02/15 118 lb (53.524 kg)  04/16/15 113 lb (51.256 kg)      ASSESSMENT AND PLAN:  # Shortness of breath: Madison [name]'s shortness of breath does not seem to be related to heart failure or ischemia.  We will obtain an echocardiogram to ensure that she does not have structural heart disease or pulmonary hypertension.    # Atypical chest pain: Her chest pain is very atypical, occurs only after eating and never with exertion. We will defer an ischemia evaluation at this.  Her symptoms seem consistent with GERD.  Current medicines are reviewed at length with the patient today.  The patient does not have concerns regarding medicines.  The following changes have been made:  no change  Labs/ tests ordered today include:   Orders Placed This Encounter  Procedures  . EKG 12-Lead  . ECHOCARDIOGRAM COMPLETE     Disposition:   FU with Pressley Barsky C. Oval Linsey, MD, [facility] as needed.     This note was written with the assistance of speech recognition software.  Please excuse any transcriptional errors.  Signed, Maire Govan C. Oval Linsey, MD, [facility]  11/16/2015 5:34 PM    [place]

## 2015-11-16 NOTE — Patient Instructions (Signed)
Medication Instructions:  Your physician recommends that you continue on your current medications as directed. Please refer to the Current Medication list given to you today.  Labwork: NONE  Testing/Procedures: Your physician has requested that you have an echocardiogram. Echocardiography is a painless test that uses sound waves to create images of your heart. It provides your doctor with information about the size and shape of your heart and how well your heart's chambers and valves are working. This procedure takes approximately one hour. There are no restrictions for this procedure. [place]  Follow-Up: AS NEEDED   If you need a refill on your cardiac medications before your next appointment, please call your pharmacy.

## 2015-11-22 DIAGNOSIS — R350 Frequency of micturition: Secondary | ICD-10-CM | POA: Diagnosis not present

## 2015-12-06 ENCOUNTER — Other Ambulatory Visit: Payer: Self-pay

## 2015-12-06 ENCOUNTER — Ambulatory Visit (HOSPITAL_COMMUNITY): Payer: PPO | Attending: Cardiovascular Disease

## 2015-12-06 DIAGNOSIS — I340 Nonrheumatic mitral (valve) insufficiency: Secondary | ICD-10-CM | POA: Diagnosis not present

## 2015-12-06 DIAGNOSIS — H15102 Unspecified episcleritis, left eye: Secondary | ICD-10-CM | POA: Diagnosis not present

## 2015-12-06 DIAGNOSIS — R0602 Shortness of breath: Secondary | ICD-10-CM | POA: Diagnosis not present

## 2015-12-06 DIAGNOSIS — R0600 Dyspnea, unspecified: Secondary | ICD-10-CM | POA: Diagnosis not present

## 2015-12-06 DIAGNOSIS — I351 Nonrheumatic aortic (valve) insufficiency: Secondary | ICD-10-CM | POA: Diagnosis not present

## 2015-12-06 DIAGNOSIS — H44413 Flat anterior chamber hypotony of eye, bilateral: Secondary | ICD-10-CM | POA: Diagnosis not present

## 2015-12-06 DIAGNOSIS — I071 Rheumatic tricuspid insufficiency: Secondary | ICD-10-CM | POA: Diagnosis not present

## 2015-12-06 DIAGNOSIS — H2513 Age-related nuclear cataract, bilateral: Secondary | ICD-10-CM | POA: Diagnosis not present

## 2015-12-06 LAB — ECHOCARDIOGRAM COMPLETE
AVPHT: 511 ms
CHL CUP DOP CALC LVOT VTI: 28.7 cm
E decel time: 222 msec
E/e' ratio: 9.79
FS: 40 % (ref 28–44)
IV/PV OW: 1.08
LA ID, A-P, ES: 29 mm
LA diam end sys: 29 mm
LA diam index: 1.87 cm/m2
LA vol A4C: 25 ml
LAVOL: 26 mL
LAVOLIN: 16.8 mL/m2
LV TDI E'LATERAL: 6.8
LV e' LATERAL: 6.8 cm/s
LVEEAVG: 9.79
LVEEMED: 9.79
LVOT SV: 73 mL
LVOT area: 2.54 cm2
LVOT diameter: 18 mm
LVOTPV: 105 cm/s
MV Dec: 222
MV pk A vel: 78.5 m/s
MVPKEVEL: 66.6 m/s
PW: 7.29 mm — AB (ref 0.6–1.1)
Reg peak vel: 222 cm/s
TDI e' medial: 6.03
TR max vel: 222 cm/s

## 2015-12-11 ENCOUNTER — Telehealth: Payer: Self-pay | Admitting: Cardiovascular Disease

## 2015-12-11 DIAGNOSIS — H15102 Unspecified episcleritis, left eye: Secondary | ICD-10-CM | POA: Diagnosis not present

## 2015-12-11 NOTE — Telephone Encounter (Signed)
Pt would like echo results from last Wednesday please. Pt is going out of town tomorrow,if not at [employer].

## 2015-12-11 NOTE — Telephone Encounter (Signed)
Spoke with pt, she continues to have trouble with SOB. Preliminary echo results discussed with the pt. She understands dr Oval Linsey has not reviewed yet. She would like the results called to her cell phone if called between now and Wednesday this week.

## 2015-12-13 NOTE — Telephone Encounter (Signed)
Returning your call. °

## 2015-12-13 NOTE — Telephone Encounter (Signed)
Advised patient of results At this time she does not wish to proceed with any other testing stating that some days her shortness of breath is worse than others Advised her to call back if any she changed her mind

## 2015-12-13 NOTE — Telephone Encounter (Signed)
Left message to call back  

## 2015-12-13 NOTE — Telephone Encounter (Signed)
-----   Message from Skeet Latch, MD sent at 12/11/2015  4:07 PM EDT ----- Echo shows that her heart is squeezing well but her heart does not relax completely.  This is very common at her age. It will be important keep her blood pressure well-controlled.  There was mild-moderate leaking of the aortic valve and mild leaking of the tricuspid valve.  None of this is severe enough to cause shortness of breath.  If she is interested we can order a Lexiscan Myoview to evaluate for ischemia.  Also PFTs.

## 2015-12-22 ENCOUNTER — Ambulatory Visit: Payer: Medicare HMO | Admitting: Cardiology

## 2015-12-23 DIAGNOSIS — N390 Urinary tract infection, site not specified: Secondary | ICD-10-CM | POA: Diagnosis not present

## 2015-12-23 DIAGNOSIS — R300 Dysuria: Secondary | ICD-10-CM | POA: Diagnosis not present

## 2015-12-23 DIAGNOSIS — B9620 Unspecified Escherichia coli [E. coli] as the cause of diseases classified elsewhere: Secondary | ICD-10-CM | POA: Diagnosis not present

## 2016-01-23 DIAGNOSIS — R030 Elevated blood-pressure reading, without diagnosis of hypertension: Secondary | ICD-10-CM | POA: Diagnosis not present

## 2016-01-23 DIAGNOSIS — H6981 Other specified disorders of Eustachian tube, right ear: Secondary | ICD-10-CM | POA: Diagnosis not present

## 2016-02-12 DIAGNOSIS — R030 Elevated blood-pressure reading, without diagnosis of hypertension: Secondary | ICD-10-CM | POA: Diagnosis not present

## 2016-02-21 DIAGNOSIS — C44622 Squamous cell carcinoma of skin of right upper limb, including shoulder: Secondary | ICD-10-CM | POA: Diagnosis not present

## 2016-02-21 DIAGNOSIS — D485 Neoplasm of uncertain behavior of skin: Secondary | ICD-10-CM | POA: Diagnosis not present

## 2016-02-21 DIAGNOSIS — L570 Actinic keratosis: Secondary | ICD-10-CM | POA: Diagnosis not present

## 2016-02-22 ENCOUNTER — Telehealth: Payer: Self-pay | Admitting: Gastroenterology

## 2016-02-22 NOTE — Telephone Encounter (Signed)
Pt complains of abd burning and reflux, Pt will increase protonix to twice daily and call back in a week to update.

## 2016-03-13 DIAGNOSIS — C44622 Squamous cell carcinoma of skin of right upper limb, including shoulder: Secondary | ICD-10-CM | POA: Diagnosis not present

## 2016-03-13 DIAGNOSIS — L988 Other specified disorders of the skin and subcutaneous tissue: Secondary | ICD-10-CM | POA: Diagnosis not present

## 2016-05-06 ENCOUNTER — Other Ambulatory Visit: Payer: Self-pay | Admitting: Gastroenterology

## 2016-05-08 ENCOUNTER — Encounter: Payer: Self-pay | Admitting: Gastroenterology

## 2016-05-08 ENCOUNTER — Encounter (INDEPENDENT_AMBULATORY_CARE_PROVIDER_SITE_OTHER): Payer: Self-pay

## 2016-05-08 ENCOUNTER — Ambulatory Visit (INDEPENDENT_AMBULATORY_CARE_PROVIDER_SITE_OTHER): Payer: PPO | Admitting: Gastroenterology

## 2016-05-08 VITALS — BP 140/64 | HR 84 | Ht 63.75 in | Wt 120.5 lb

## 2016-05-08 DIAGNOSIS — K219 Gastro-esophageal reflux disease without esophagitis: Secondary | ICD-10-CM

## 2016-05-08 DIAGNOSIS — R1013 Epigastric pain: Secondary | ICD-10-CM

## 2016-05-08 NOTE — Patient Instructions (Addendum)
  If you are age 80 or older, your body mass index should be between 23-30. Your Body mass index is 20.85 kg/m. If this is out of the aforementioned range listed, please consider follow up with your Primary Care Provider.  If you are age 64 or younger, your body mass index should be between 19-25. Your Body mass index is 20.85 kg/m. If this is out of the aformentioned range listed, please consider follow up with your Primary Care Provider.   Please follow up with Dr Ardis Hughs as needed. Thank you!

## 2016-05-08 NOTE — Progress Notes (Signed)
Review of pertinent gastrointestinal problems:  1. Family history for colon cancer (diagnosed in her late 49s), personal history of tubular adenomas. Last colonoscopy November 2007 by Dr. Lyla Son, tortuous although normal. Next colonoscopy November 2012. Was scheduled for colonoscopy 2012 but she had difficulty with the prep (moviprep) and she canceled it. 10/2011 Colonoscopy Dr. Ardis Hughs with different prep; normal except small hemorrhoids. Recommended no further CRC screening given age. 2. Chronic gastroesophageal reflux disease. Has been on a variety of anti-acid, on a variety of PPIs with mixed results. esophagogastroduodenoscopy January 2005 by Dr. Lyla Son. No esophagitis but a Schatzki's ring was seen. Hyperplastic polyps in the gastric body. EGD February 2008 by Dr. Ardis Hughs was normal except for previously sampled hyperplastic-appearing gastric polyps.  3. Chronic dyspepsia, somewhat GERD like.  Repeat EGD Dr. Ardis Hughs 12/2014 found mild gastritis, h. Pylori negative. Follow up CT scan 12/2014 was unrevealing.  Improved on omeprazole.   HPI: This is a    very pleasant 82 year old woman whom I last saw about 1 year ago  Chief complaint is intermittent GERD-like dyspepsia  She called 2 monhts ago, was having epigastric pain, belching.   Acid tasting in her mouth.  She started to avoid acid containing foodd  She Increased her generic PPI. 40mg  pantoprazole to twice daily then decreased back to once daily.  After big meals or unusual foods she will have gas.  She avoids cheese, much other dairy as well.    Her weight is up 2 pounds compared to a year ago (same scale here in GI office).  ROS: complete GI ROS as described in HPI.  Constitutional:  No unintentional weight loss   Past Medical History:  Diagnosis Date  . Arthritis   . Bladder infection   . Cancer, skin, squamous cell   . Colon polyps   . Frozen shoulder    left   . Gallstones   . GERD (gastroesophageal reflux  disease)   . Hiatal hernia   . IBS (irritable bowel syndrome)   . Osteopenia   . Restless leg syndrome     Past Surgical History:  Procedure Laterality Date  . APPENDECTOMY    . CESAREAN SECTION    . CHOLECYSTECTOMY      Current Outpatient Prescriptions  Medication Sig Dispense Refill  . ALPRAZolam (XANAX) 0.25 MG tablet Take 0.25 mg by mouth at bedtime as needed.    . Calcium-Vitamin D-Vitamin K (VIACTIV) [id number] MG-UNT-MCG CHEW Chew 2 each by mouth daily.      Marland Kitchen omeprazole (PRILOSEC) 40 MG capsule TAKE 1 CAPSULE (40 MG TOTAL) BY MOUTH 2 (TWO) TIMES DAILY. TAKE 20-30 PRIOR TO BREAKFAST AND DINNER. 60 capsule 6   No current facility-administered medications for this visit.     Allergies as of 05/08/2016 - Review Complete 05/08/2016  Allergen Reaction Noted  . Nitroglycerin Other (See Comments) 11/16/2015    Family History  Problem Relation Age of Onset  . Colon cancer Mother 23  . Heart disease Father     MI  . Heart disease Brother   . Lung cancer Brother     x3  . Diabetes Brother   . Hypertension Brother   . Bone cancer Sister   . Lung cancer Sister     remission  . Throat cancer Brother   . Diabetes Maternal Grandmother   . Colon polyps Neg Hx   . Liver disease Neg Hx   . Kidney disease Neg Hx     Social History  Social History  . Marital status: Married    Spouse name: N/A  . Number of children: 2  . Years of education: N/A   Occupational History  . retired    Social History Main Topics  . Smoking status: Never Smoker  . Smokeless tobacco: Never Used  . Alcohol use 0.0 oz/week     Comment: occasionally  . Drug use: No  . Sexual activity: Not on file   Other Topics Concern  . Not on file   Social History Narrative  . No narrative on file     Physical Exam: Ht 5' 3.75" (1.619 m)   Wt 120 lb 8 oz (54.7 kg)   BMI 20.85 kg/m  Constitutional: generally well-appearing Psychiatric: alert and oriented x3 Abdomen: soft, nontender,  nondistended, no obvious ascites, no peritoneal signs, normal bowel sounds No peripheral edema noted in lower extremities  Assessment and plan: 82 y.o. female with Chronic GERD, intermittent GERD associated dyspepsia  She briefly increase her proton pump inhibitor twice daily when she had her last episode of GERD-like dyspepsia. This helped. She has no alarm symptoms. She has had endoscopic and cross-sectional imaging within the past 1-2 years and none of that needs to be repeated now. I recommended she continue taking her once daily proton pump inhibitor and if she is bothered again by resurgence of dyspepsia she should double up on it again and call only if that does not help after a few weeks.   Owens Loffler, MD [facility] 05/08/2016, 8:50 AM

## 2016-05-16 IMAGING — CT CT CHEST W/O CM
2 of 4 series · 15 of 36 positions shown, 18 images · non-contrast
Comparison: CT chest 04/15/2013

CLINICAL DATA: Fell with bruising to the LEFT chest. Fall last
night. Persistent pain.

EXAM:
CT CHEST WITHOUT CONTRAST
TECHNIQUE: Multidetector CT imaging of the chest was performed following the
standard protocol without IV contrast.

[Series 4: chest w/o 3mm st cor · coronal · non-contrast · 0.59mm/px · 3 of 71 slices shown]
[im 15/71  lung]
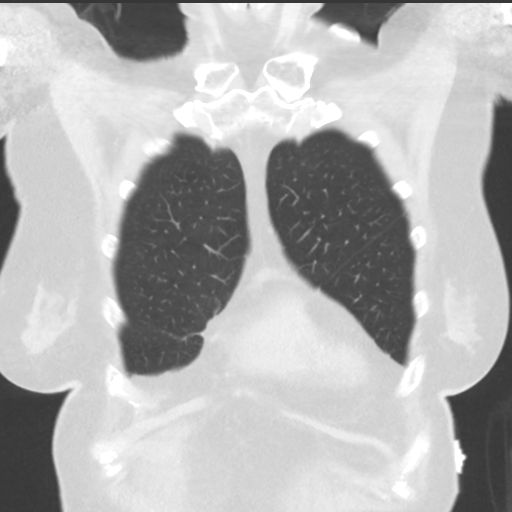
[im 29/71  lung]
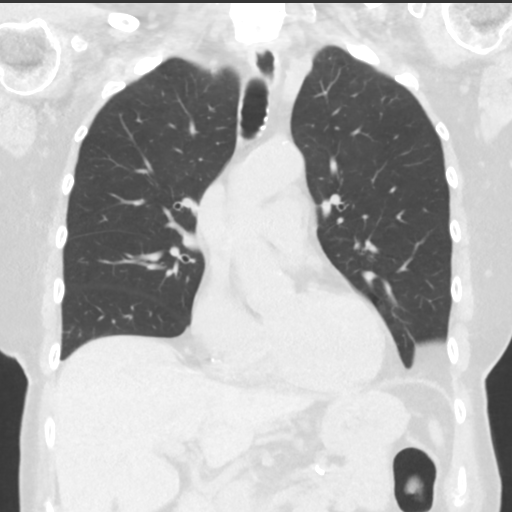
[im 43/71  lung]
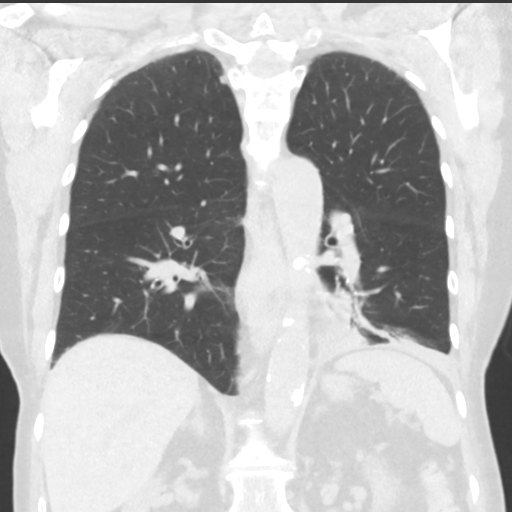

[Series 6: chest w/o 1mm st · axial · non-contrast · 0.62mm/px · z∈[+1052,+1323]mm · 12 of 372 slices shown, 15 images]
[im 17/372  mediastinal]
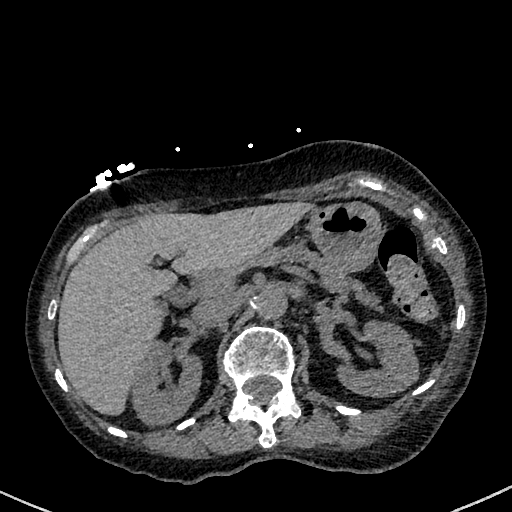
[im 17/372  lung]
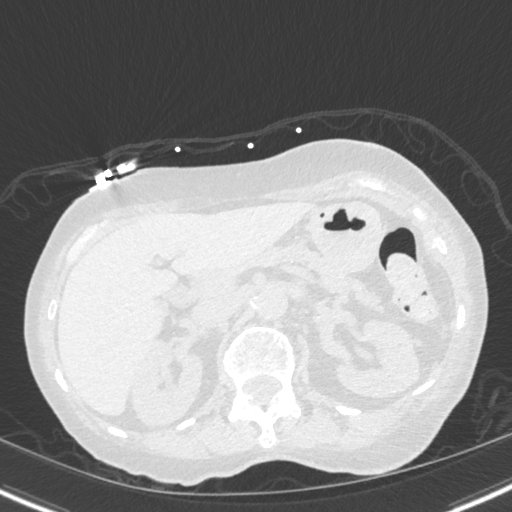
[im 49/372  lung]
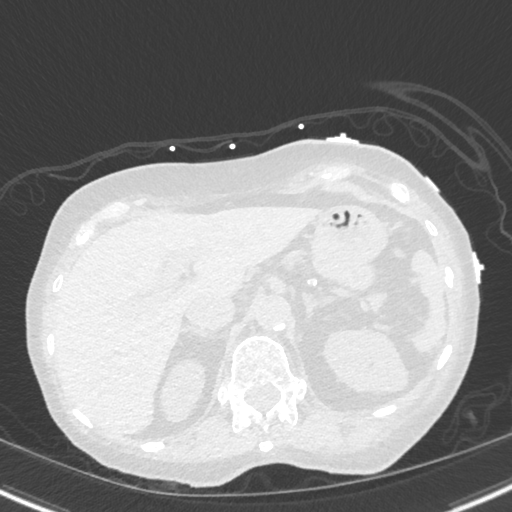
[im 81/372  lung]
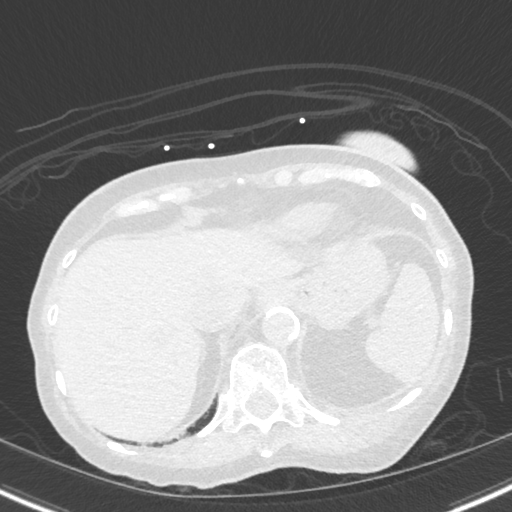
[im 113/372  lung]
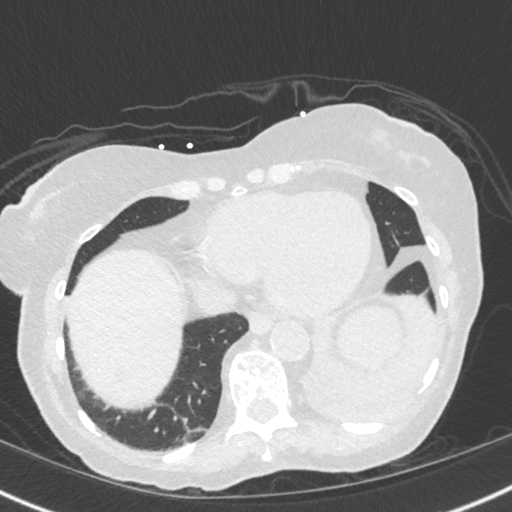
[im 146/372  mediastinal]
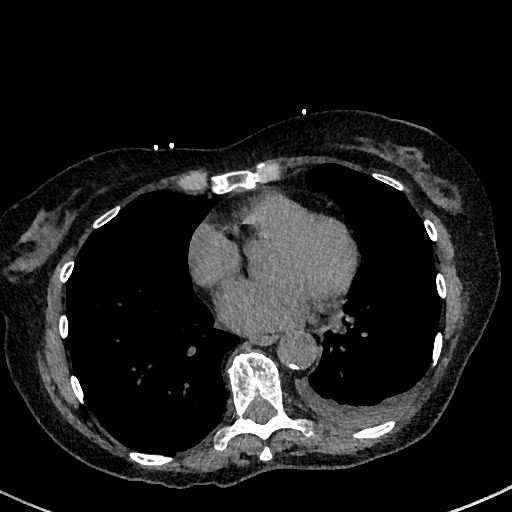
[im 146/372  lung]
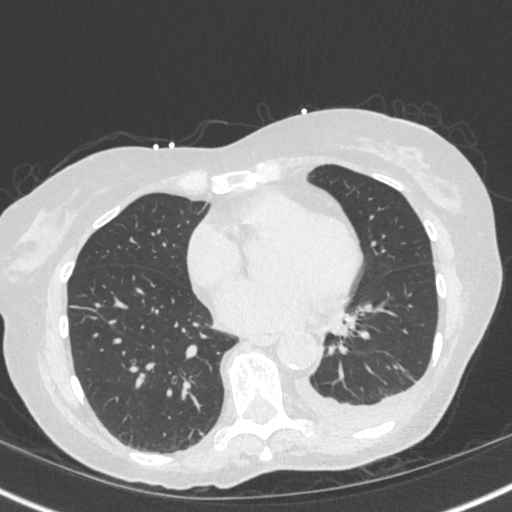
[im 178/372  lung]
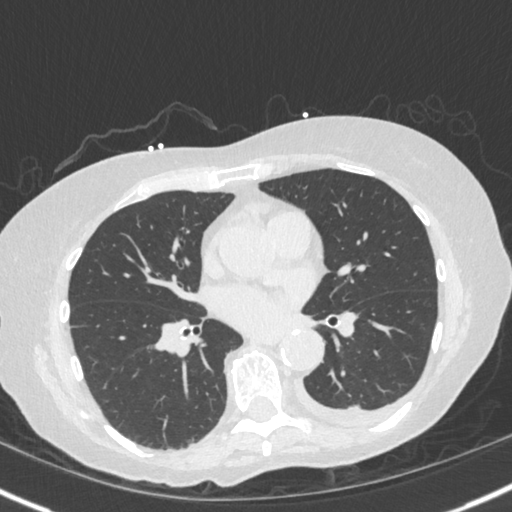
[im 194/372  lung]
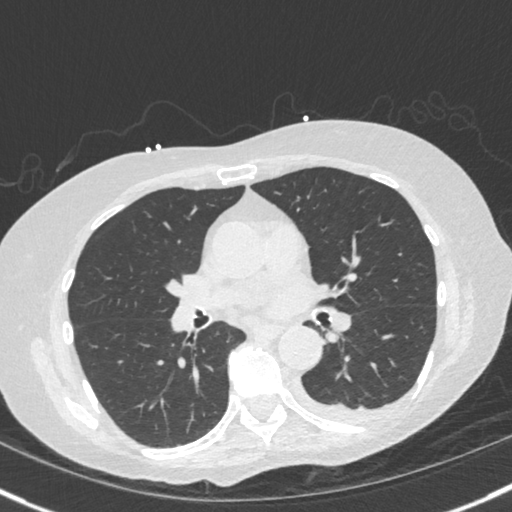
[im 226/372  lung]
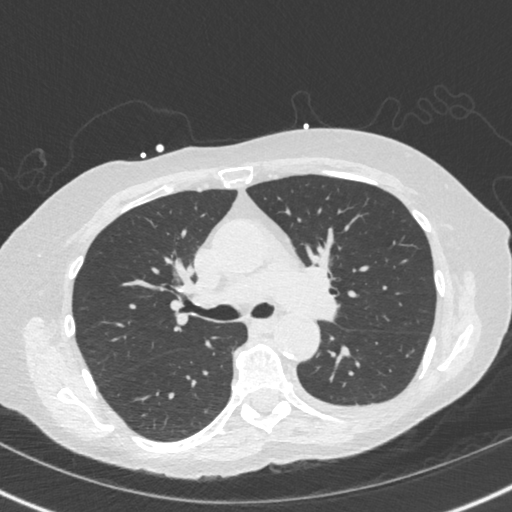
[im 259/372  mediastinal]
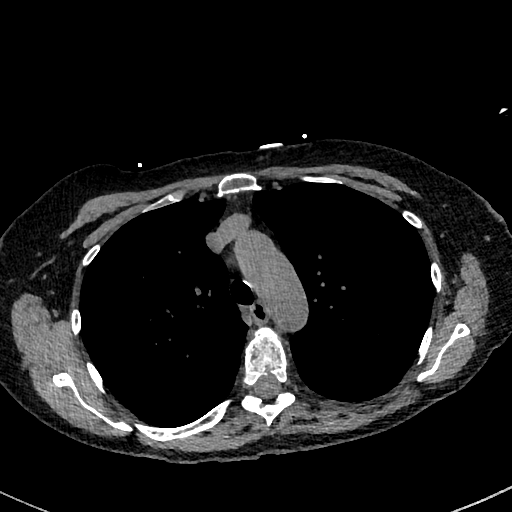
[im 259/372  lung]
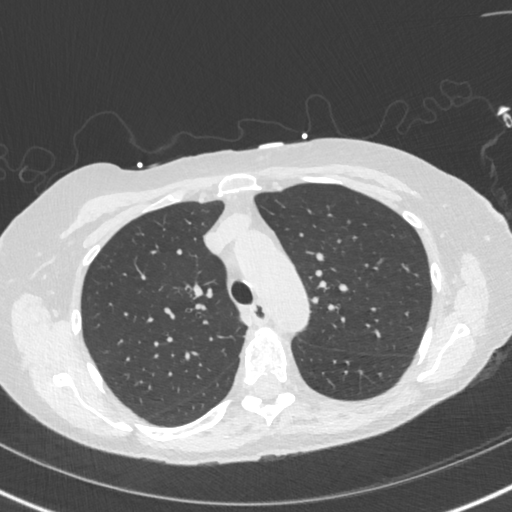
[im 291/372  lung]
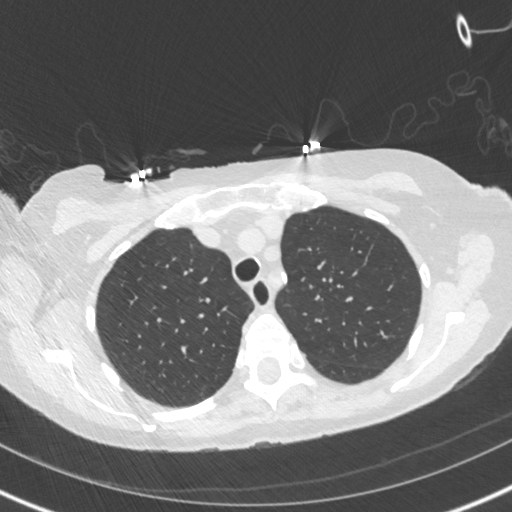
[im 323/372  lung]
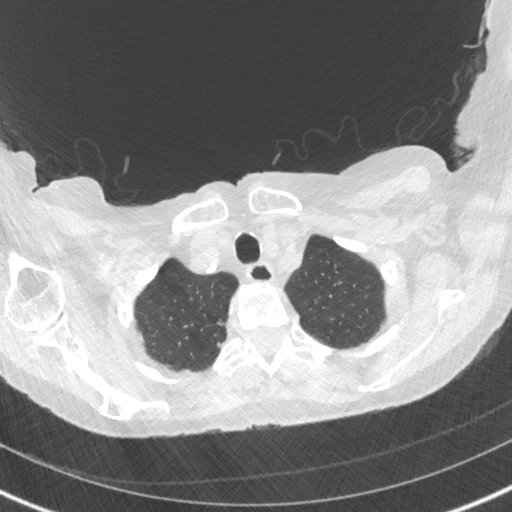
[im 355/372  lung]
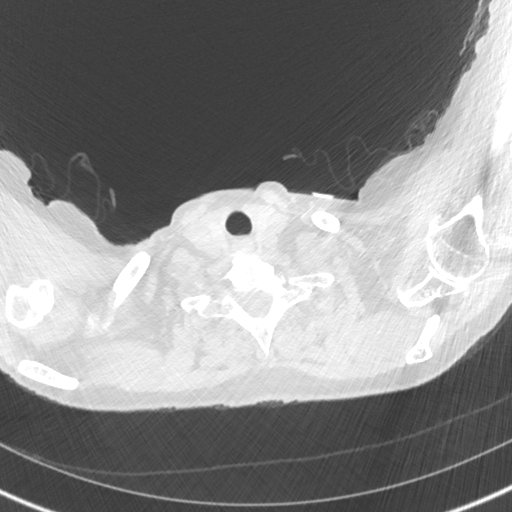

[15 of 36 positions shown; findings below may reference images not displayed]

FINDINGS: Mediastinum/Nodes: No axillary supraclavicular adenopathy. No
mediastinal hilar adenopathy. No evidence of mediastinal hematoma.
The aorta is normal. No pericardial fluid. Abnormality esophagus a
central airway.

Lungs/Pleura: There is a small LEFT pleural effusion. Adjacent
minimally displaced LEFT eleventh and tenth rib fractures. No
pneumothorax. No pulmonary contusion.

Upper abdomen: Low-density lesion in the RIGHT hepatic lobe measures
15 mm on image 56, series 2. this lesion is characterized benign
hemangioma on CT of 02/16/2015. Normal adrenal glands.

Musculoskeletal: Posterior LEFT eleventh and tenth rib fractures
(image 44, series 2 and image 50 series 2.
IMPRESSION: 1. Minimally displaced posterior LEFT tenth and eleventh post rib
fractures.
2. Small associated LEFT pleural effusion. No pulmonary contusion or
pneumothorax.

## 2016-05-20 DIAGNOSIS — M8580 Other specified disorders of bone density and structure, unspecified site: Secondary | ICD-10-CM | POA: Diagnosis not present

## 2016-05-20 DIAGNOSIS — R5383 Other fatigue: Secondary | ICD-10-CM | POA: Diagnosis not present

## 2016-05-20 DIAGNOSIS — F419 Anxiety disorder, unspecified: Secondary | ICD-10-CM | POA: Diagnosis not present

## 2016-05-20 DIAGNOSIS — Z13220 Encounter for screening for lipoid disorders: Secondary | ICD-10-CM | POA: Diagnosis not present

## 2016-05-20 DIAGNOSIS — Z0000 Encounter for general adult medical examination without abnormal findings: Secondary | ICD-10-CM | POA: Diagnosis not present

## 2016-05-30 DIAGNOSIS — R0602 Shortness of breath: Secondary | ICD-10-CM | POA: Diagnosis not present

## 2016-06-12 DIAGNOSIS — Z1231 Encounter for screening mammogram for malignant neoplasm of breast: Secondary | ICD-10-CM | POA: Diagnosis not present

## 2016-06-12 DIAGNOSIS — M810 Age-related osteoporosis without current pathological fracture: Secondary | ICD-10-CM | POA: Diagnosis not present

## 2016-06-24 DIAGNOSIS — R300 Dysuria: Secondary | ICD-10-CM | POA: Diagnosis not present

## 2016-06-25 ENCOUNTER — Institutional Professional Consult (permissible substitution): Payer: PPO | Admitting: Pulmonary Disease

## 2016-06-30 DIAGNOSIS — N390 Urinary tract infection, site not specified: Secondary | ICD-10-CM | POA: Diagnosis not present

## 2016-06-30 DIAGNOSIS — R110 Nausea: Secondary | ICD-10-CM | POA: Diagnosis not present

## 2016-07-08 DIAGNOSIS — R110 Nausea: Secondary | ICD-10-CM | POA: Diagnosis not present

## 2016-07-08 DIAGNOSIS — H938X1 Other specified disorders of right ear: Secondary | ICD-10-CM | POA: Diagnosis not present

## 2016-07-08 DIAGNOSIS — T3695XA Adverse effect of unspecified systemic antibiotic, initial encounter: Secondary | ICD-10-CM | POA: Diagnosis not present

## 2016-07-11 ENCOUNTER — Telehealth: Payer: Self-pay | Admitting: Gastroenterology

## 2016-07-11 NOTE — Telephone Encounter (Signed)
Pt advised to begin protonix BID and zantac at bedtime for a few weeks and call back with an update.

## 2016-07-29 ENCOUNTER — Telehealth: Payer: Self-pay | Admitting: Gastroenterology

## 2016-07-29 MED ORDER — LANSOPRAZOLE 30 MG PO CPDR: 30.0000 mg | DELAYED_RELEASE_CAPSULE | Freq: Two times a day (BID) | ORAL | 0 refills | Status: DC

## 2016-07-29 NOTE — Telephone Encounter (Signed)
The pt is calling because she would like to start on prevacid because she tried that years ago and it helped.  The protonix is not seeming to work as well anymore.  Prevacid was sent and she will call in 1-2 weeks to update on her condition or sooner if the prevacid is to expensive.

## 2016-08-05 DIAGNOSIS — H2513 Age-related nuclear cataract, bilateral: Secondary | ICD-10-CM | POA: Diagnosis not present

## 2016-08-05 DIAGNOSIS — H43813 Vitreous degeneration, bilateral: Secondary | ICD-10-CM | POA: Diagnosis not present

## 2016-08-05 DIAGNOSIS — H44413 Flat anterior chamber hypotony of eye, bilateral: Secondary | ICD-10-CM | POA: Diagnosis not present

## 2016-08-06 DIAGNOSIS — Z23 Encounter for immunization: Secondary | ICD-10-CM | POA: Diagnosis not present

## 2016-08-06 DIAGNOSIS — D485 Neoplasm of uncertain behavior of skin: Secondary | ICD-10-CM | POA: Diagnosis not present

## 2016-08-06 DIAGNOSIS — L570 Actinic keratosis: Secondary | ICD-10-CM | POA: Diagnosis not present

## 2016-08-06 DIAGNOSIS — L905 Scar conditions and fibrosis of skin: Secondary | ICD-10-CM | POA: Diagnosis not present

## 2016-08-06 DIAGNOSIS — D0439 Carcinoma in situ of skin of other parts of face: Secondary | ICD-10-CM | POA: Diagnosis not present

## 2016-08-06 DIAGNOSIS — Z85828 Personal history of other malignant neoplasm of skin: Secondary | ICD-10-CM | POA: Diagnosis not present

## 2016-08-06 DIAGNOSIS — L821 Other seborrheic keratosis: Secondary | ICD-10-CM | POA: Diagnosis not present

## 2016-08-22 ENCOUNTER — Other Ambulatory Visit: Payer: Self-pay | Admitting: Gastroenterology

## 2016-09-09 DIAGNOSIS — B9689 Other specified bacterial agents as the cause of diseases classified elsewhere: Secondary | ICD-10-CM | POA: Diagnosis not present

## 2016-09-09 DIAGNOSIS — R399 Unspecified symptoms and signs involving the genitourinary system: Secondary | ICD-10-CM | POA: Diagnosis not present

## 2016-09-09 DIAGNOSIS — R1031 Right lower quadrant pain: Secondary | ICD-10-CM | POA: Diagnosis not present

## 2016-09-09 DIAGNOSIS — N3001 Acute cystitis with hematuria: Secondary | ICD-10-CM | POA: Diagnosis not present

## 2016-10-01 ENCOUNTER — Other Ambulatory Visit: Payer: Self-pay | Admitting: Gastroenterology

## 2016-10-30 ENCOUNTER — Telehealth: Payer: Self-pay | Admitting: Gastroenterology

## 2016-10-30 MED ORDER — PANTOPRAZOLE SODIUM 40 MG PO TBEC: 40.0000 mg | DELAYED_RELEASE_TABLET | Freq: Every day | ORAL | 6 refills | Status: DC

## 2016-10-30 NOTE — Telephone Encounter (Signed)
Pt states protonix works better and would like to switch back.  I have sent in a prescription for once daily protonix.

## 2016-12-02 ENCOUNTER — Ambulatory Visit (INDEPENDENT_AMBULATORY_CARE_PROVIDER_SITE_OTHER): Payer: PPO | Admitting: Gastroenterology

## 2016-12-02 VITALS — BP 156/66 | HR 70 | Ht 63.0 in | Wt 117.0 lb

## 2016-12-02 DIAGNOSIS — R1013 Epigastric pain: Secondary | ICD-10-CM | POA: Diagnosis not present

## 2016-12-02 NOTE — Patient Instructions (Signed)
Continue your protonix and zantac as you are taking them. Call if needed.

## 2016-12-02 NOTE — Progress Notes (Signed)
Review of pertinent gastrointestinal problems:  1. Family history for colon cancer (diagnosed in her late 51s), personal history of tubular adenomas. Last colonoscopy November 2007 by Dr. Lyla Son, tortuous although normal. Next colonoscopy November 2012. Was scheduled for colonoscopy 2012 but she had difficulty with the prep (moviprep) and she canceled it. 10/2011 Colonoscopy Dr. Ardis Hughs with different prep; normal except small hemorrhoids. Recommended no further CRC screening given age. 2. Chronic gastroesophageal reflux disease. Has been on a variety of anti-acid, on a variety of PPIs with mixed results. esophagogastroduodenoscopy January 2005 by Dr. Lyla Son. No esophagitis but a Schatzki's ring was seen. Hyperplastic polyps in the gastric body. EGD February 2008 by Dr. Ardis Hughs was normal except for previously sampled hyperplastic-appearing gastric polyps.  3. Chronic dyspepsia, somewhat GERD like. Repeat EGD Dr. Ardis Hughs 12/2014 found mild gastritis, h. Pylori negative. Follow up CT scan 12/2014 was unrevealing. Improved on omeprazole. 2017/2018 intermittently improves on variety of PPI trials.    HPI: This is a    very pleasant 83 year old woman whom I last saw 7 months ago  Chief complaint is  dyspepsia     UTI January this year, was on antibiotics and this really caused some stomach issues, dyspepsia.  Cipro for 4 days: caused severe nausea, no vomiting. Also abd pains.  We went through a variety of PPIs.  Eventually back on protonix and also zantac.  She generally takes the PPI after BF.  And takes zantac 150 mid afternoon one pill.    Lost some weight but this is coming back.  Net weight down 3 pounds in 7 months (same scale )  ROS: complete GI ROS as described in HPI, all other review negative.  Constitutional:  No unintentional weight loss   Past Medical History:  Diagnosis Date  . Arthritis   . Bladder infection   . Cancer, skin, squamous cell   . Colon polyps   .  Frozen shoulder    left   . Gallstones   . GERD (gastroesophageal reflux disease)   . Hiatal hernia   . IBS (irritable bowel syndrome)   . Osteopenia   . Restless leg syndrome     Past Surgical History:  Procedure Laterality Date  . APPENDECTOMY    . CESAREAN SECTION    . CHOLECYSTECTOMY      Current Outpatient Prescriptions  Medication Sig Dispense Refill  . ALPRAZolam (XANAX) 0.25 MG tablet Take 0.25 mg by mouth at bedtime as needed.    . Calcium-Vitamin D-Vitamin K (VIACTIV) 161-096-04 MG-UNT-MCG CHEW Chew 2 each by mouth daily.      . ranitidine (ZANTAC) 150 MG capsule Take 150 mg by mouth daily.    . pantoprazole (PROTONIX) 40 MG tablet Take 1 tablet (40 mg total) by mouth daily. 30 tablet 6   No current facility-administered medications for this visit.     Allergies as of 12/02/2016 - Review Complete 12/02/2016  Allergen Reaction Noted  . Nitroglycerin Other (See Comments) 11/16/2015    Family History  Problem Relation Age of Onset  . Colon cancer Mother 3  . Heart disease Father        MI  . Heart disease Brother   . Lung cancer Brother        x3  . Diabetes Brother   . Hypertension Brother   . Bone cancer Sister   . Lung cancer Sister        remission  . Throat cancer Brother   . Diabetes Maternal Grandmother   .  Colon polyps Neg Hx   . Liver disease Neg Hx   . Kidney disease Neg Hx     Social History   Social History  . Marital status: Married    Spouse name: N/A  . Number of children: 2  . Years of education: N/A   Occupational History  . retired    Social History Main Topics  . Smoking status: Never Smoker  . Smokeless tobacco: Never Used  . Alcohol use 0.0 oz/week     Comment: occasionally  . Drug use: No  . Sexual activity: Not on file   Other Topics Concern  . Not on file   Social History Narrative  . No narrative on file     Physical Exam: BP (!) 156/66   Pulse 70   Ht 5\' 3"  (1.6 m)   Wt 117 lb (53.1 kg)   BMI 20.73  kg/m  Constitutional: generally well-appearing Psychiatric: alert and oriented x3 Abdomen: soft, nontender, nondistended, no obvious ascites, no peritoneal signs, normal bowel sounds No peripheral edema noted in lower extremities  Assessment and plan: 83 y.o. female wiintermittent likely GERD related dyspepsia  her dyspeptic symptoms improved after some time off of the antibiotics she was taking for UTI. She also manipulated her proton pump inhibitor dosage and timing of it. She is very happy with the current regimen which is Protonix shortly after her breakfast meal and Zantac in the early afternoon. She had CT scan, upper endoscopy less than 2 years ago and I do not think either of those need to be repeated now. She really has no alarm symptoms since her weight is coming back on. We will just observe her clinically I reassured her that is unlikely anything serious is going on. He will call as needed.  Please see the "Patient Instructions" section for addition details about the plan.  Owens Loffler, MD [facility] 12/02/2016, 8:53 AM

## 2016-12-10 DIAGNOSIS — D0462 Carcinoma in situ of skin of left upper limb, including shoulder: Secondary | ICD-10-CM | POA: Diagnosis not present

## 2016-12-10 DIAGNOSIS — L570 Actinic keratosis: Secondary | ICD-10-CM | POA: Diagnosis not present

## 2016-12-10 DIAGNOSIS — D485 Neoplasm of uncertain behavior of skin: Secondary | ICD-10-CM | POA: Diagnosis not present

## 2017-01-29 DIAGNOSIS — D0462 Carcinoma in situ of skin of left upper limb, including shoulder: Secondary | ICD-10-CM | POA: Diagnosis not present

## 2017-03-04 DIAGNOSIS — R0981 Nasal congestion: Secondary | ICD-10-CM | POA: Diagnosis not present

## 2017-03-04 DIAGNOSIS — N3000 Acute cystitis without hematuria: Secondary | ICD-10-CM | POA: Diagnosis not present

## 2017-03-12 ENCOUNTER — Telehealth: Payer: Self-pay | Admitting: Gastroenterology

## 2017-03-12 NOTE — Telephone Encounter (Signed)
The pt states she has reflux "flare" on protonix and zantac at bedtime.  I reviewed the anti reflux precautions and advised her to try protonix BID.  She will call back in 2 weeks to update on her response

## 2017-03-20 ENCOUNTER — Other Ambulatory Visit: Payer: Self-pay | Admitting: Gastroenterology

## 2017-04-08 DIAGNOSIS — N952 Postmenopausal atrophic vaginitis: Secondary | ICD-10-CM | POA: Diagnosis not present

## 2017-04-14 ENCOUNTER — Telehealth: Payer: Self-pay | Admitting: Gastroenterology

## 2017-04-14 NOTE — Telephone Encounter (Signed)
The pt has been advised to continue taking Protonix as directed and if she has any kidney changes she is to call her PCP and Dr Ardis Hughs.  She agreed and will call as requested

## 2017-04-21 DIAGNOSIS — R0981 Nasal congestion: Secondary | ICD-10-CM | POA: Diagnosis not present

## 2017-04-21 DIAGNOSIS — N390 Urinary tract infection, site not specified: Secondary | ICD-10-CM | POA: Insufficient documentation

## 2017-04-24 ENCOUNTER — Emergency Department (HOSPITAL_COMMUNITY)
Admission: EM | Admit: 2017-04-24 | Discharge: 2017-04-24 | Disposition: A | Discharge status: Home / Self Care | Payer: PPO | Attending: Emergency Medicine | Admitting: Emergency Medicine

## 2017-04-24 ENCOUNTER — Encounter (HOSPITAL_COMMUNITY): Payer: Self-pay | Admitting: Emergency Medicine

## 2017-04-24 ENCOUNTER — Emergency Department (HOSPITAL_COMMUNITY): Payer: PPO

## 2017-04-24 ENCOUNTER — Other Ambulatory Visit: Payer: Self-pay

## 2017-04-24 DIAGNOSIS — R0789 Other chest pain: Secondary | ICD-10-CM | POA: Diagnosis not present

## 2017-04-24 DIAGNOSIS — R0981 Nasal congestion: Secondary | ICD-10-CM | POA: Insufficient documentation

## 2017-04-24 DIAGNOSIS — R0602 Shortness of breath: Secondary | ICD-10-CM | POA: Diagnosis not present

## 2017-04-24 DIAGNOSIS — Z79899 Other long term (current) drug therapy: Secondary | ICD-10-CM | POA: Insufficient documentation

## 2017-04-24 DIAGNOSIS — R0600 Dyspnea, unspecified: Secondary | ICD-10-CM | POA: Diagnosis not present

## 2017-04-24 DIAGNOSIS — L74512 Primary focal hyperhidrosis, palms: Secondary | ICD-10-CM | POA: Diagnosis not present

## 2017-04-24 DIAGNOSIS — Z85828 Personal history of other malignant neoplasm of skin: Secondary | ICD-10-CM | POA: Diagnosis not present

## 2017-04-24 DIAGNOSIS — R079 Chest pain, unspecified: Secondary | ICD-10-CM | POA: Diagnosis not present

## 2017-04-24 LAB — CBC
HCT: 38.5 % (ref 36.0–46.0)
Hemoglobin: 13 g/dL (ref 12.0–15.0)
MCH: 31.7 pg (ref 26.0–34.0)
MCHC: 33.8 g/dL (ref 30.0–36.0)
MCV: 93.9 fL (ref 78.0–100.0)
PLATELETS: 299 10*3/uL (ref 150–400)
RBC: 4.1 MIL/uL (ref 3.87–5.11)
RDW: 12.5 % (ref 11.5–15.5)
WBC: 5.4 10*3/uL (ref 4.0–10.5)

## 2017-04-24 LAB — URINALYSIS, ROUTINE W REFLEX MICROSCOPIC
BACTERIA UA: NONE SEEN
Bilirubin Urine: NEGATIVE
Glucose, UA: NEGATIVE mg/dL
Ketones, ur: NEGATIVE mg/dL
Leukocytes, UA: NEGATIVE
Nitrite: NEGATIVE
Protein, ur: NEGATIVE mg/dL
Specific Gravity, Urine: 1.003 — ABNORMAL LOW (ref 1.005–1.030)
WBC, UA: NONE SEEN WBC/hpf (ref 0–5)
pH: 7 (ref 5.0–8.0)

## 2017-04-24 LAB — I-STAT TROPONIN, ED
Troponin i, poc: 0 ng/mL (ref 0.00–0.08)
Troponin i, poc: 0 ng/mL (ref 0.00–0.08)

## 2017-04-24 LAB — BASIC METABOLIC PANEL
Anion gap: 7 (ref 5–15)
BUN: 7 mg/dL (ref 6–20)
CO2: 28 mmol/L (ref 22–32)
CREATININE: 0.68 mg/dL (ref 0.44–1.00)
Calcium: 9 mg/dL (ref 8.9–10.3)
Chloride: 98 mmol/L — ABNORMAL LOW (ref 101–111)
GFR calc Af Amer: >60 mL/min (ref >60)
GFR calc non Af Amer: >60 mL/min (ref >60)
Glucose, Bld: 117 mg/dL — ABNORMAL HIGH (ref 65–99)
Potassium: 4.5 mmol/L (ref 3.5–5.1)
SODIUM: 133 mmol/L — AB (ref 135–145)

## 2017-04-24 MED ORDER — GI COCKTAIL ~~LOC~~
30.0000 mL | Freq: Once | ORAL | Status: AC
  Administered 2017-04-24: 30 mL via ORAL
  Filled 2017-04-24: qty 30

## 2017-04-24 MED ORDER — SALINE SPRAY 0.65 % NA SOLN: 1.0000 | NASAL | 0 refills | Status: DC | PRN

## 2017-04-24 NOTE — ED Provider Notes (Signed)
[facility] EMERGENCY DEPARTMENT Provider Note   CSN: [id number] Arrival date & time: 04/24/17  6269     History   Chief Complaint Chief Complaint  Patient presents with  . Chest Burning    HPI Madison [name] is a 83 y.o. female with a h/o of GERD who presents to the emergency department with a chief complaint of chest burning that intermittently radiates to her epigastric area and began 2 days ago, but worsened this morning while walking up her inclined driveway to the mailbox.  She also complains of dyspnea with exertion, sweaty palms, and nasal congestion.  She denies fever, chills, palpitations, back pain, nausea, vomiting, diarrhea, leg swelling, numbness, or weakness.  No treatment prior to arrival.  She reports that she was seen on 11/5 by her primary care provider who noted her blood pressure to be elevated.  She reports that she has been taking her blood pressure at home with the battery operated by blood pressure cuff and it is continued to be high.  No history of hypertension.  She denies taking Sudafed or other over-the-counter cold and flu medications.   She currently takes Protonix and Zantac for her GERD.  She is not established with cardiology.  No history of cardiopulmonary disease.  The history is provided by the patient. No language interpreter was used.    Past Medical History:  Diagnosis Date  . Arthritis   . Bladder infection   . Cancer, skin, squamous cell   . Colon polyps   . Frozen shoulder    left   . Gallstones   . GERD (gastroesophageal reflux disease)   . Hiatal hernia   . IBS (irritable bowel syndrome)   . Osteopenia   . Restless leg syndrome     Patient Active Problem List   Diagnosis Date Noted  . CLAUDICATION 08/31/2010  . CHEST PAIN 08/31/2010  . GERD 10/14/2007    Past Surgical History:  Procedure Laterality Date  . APPENDECTOMY    . CESAREAN SECTION    . CHOLECYSTECTOMY      OB History    No data  available       Home Medications    Prior to Admission medications   Medication Sig Start Date End Date Taking? Authorizing Provider  ALPRAZolam (XANAX) 0.25 MG tablet Take 0.125 mg at bedtime by mouth.    Yes [provider]  Calcium-Vitamin D-Vitamin K (VIACTIV) 485-462-70 MG-UNT-MCG CHEW Chew 2 each by mouth daily.     Yes [provider]  pantoprazole (PROTONIX) 40 MG tablet TAKE 1 TABLET BY MOUTH EVERY DAY Patient taking differently: TAKE 1 TABLET BY MOUTH TWICE DAILY 03/20/17  Yes Milus Banister, MD  ranitidine (ZANTAC) 150 MG capsule Take 150 mg by mouth daily.   Yes [provider]  sodium chloride (OCEAN) 0.65 % SOLN nasal spray Place 1 spray as needed into both nostrils for congestion. 04/24/17   Ameisha Mcclellan, Laymond Purser, PA-C    Family History Family History  Problem Relation Age of Onset  . Colon cancer Mother 68  . Heart disease Father        MI  . Heart disease Brother   . Lung cancer Brother        x3  . Diabetes Brother   . Hypertension Brother   . Bone cancer Sister   . Lung cancer Sister        remission  . Throat cancer Brother   . Diabetes Maternal  Grandmother   . Colon polyps Neg Hx   . Liver disease Neg Hx   . Kidney disease Neg Hx     Social History Social History   Tobacco Use  . Smoking status: Never Smoker  . Smokeless tobacco: Never Used  Substance Use Topics  . Alcohol use: Yes    Alcohol/week: 0.0 oz    Comment: occasionally wine  . Drug use: No     Allergies   Nitroglycerin   Review of Systems Review of Systems  Constitutional: Positive for diaphoresis. Negative for activity change, chills and fever.  Respiratory: Positive for shortness of breath. Negative for cough and chest tightness.   Cardiovascular: Positive for chest pain ("burning"). Negative for palpitations and leg swelling.  Gastrointestinal: Negative for abdominal distention, abdominal pain, diarrhea, nausea and vomiting.  Musculoskeletal:  Negative for arthralgias, back pain, gait problem, joint swelling, myalgias and neck stiffness.  Skin: Negative for rash.  Allergic/Immunologic: Negative for immunocompromised state.     Physical Exam Updated Vital Signs BP 132/74   Pulse 80   Temp 98.1 F (36.7 C) (Oral)   Resp 16   Ht 5\' 3"  (1.6 m)   Wt 52.2 kg (115 lb)   SpO2 98%   BMI 20.37 kg/m   Physical Exam  Constitutional: She is oriented to person, place, and time. No distress.  HENT:  Head: Normocephalic and atraumatic.  Right Ear: Tympanic membrane normal.  Left Ear: Tympanic membrane normal.  Nose: Mucosal edema present. No rhinorrhea. Right sinus exhibits no maxillary sinus tenderness and no frontal sinus tenderness. Left sinus exhibits no maxillary sinus tenderness and no frontal sinus tenderness.  Mouth/Throat: Uvula is midline and oropharynx is clear and moist.  Eyes: Conjunctivae are normal.  Neck: Neck supple. No JVD present.  Cardiovascular: Normal rate, regular rhythm, normal heart sounds and intact distal pulses. Exam reveals no gallop and no friction rub.  No murmur heard. Pulmonary/Chest: Effort normal and breath sounds normal. No stridor. No respiratory distress. She has no wheezes. She has no rales. She exhibits no tenderness.  Abdominal: Soft. Bowel sounds are normal. She exhibits no distension and no mass. There is no tenderness. There is no guarding. No hernia.  Musculoskeletal: She exhibits no edema.  Neurological: She is alert and oriented to person, place, and time.  Skin: Skin is warm. Capillary refill takes less than 2 seconds. No rash noted. She is not diaphoretic. No erythema. No pallor.  Psychiatric: Her behavior is normal.  Nursing note and vitals reviewed.   ED Treatments / Results  Labs (all labs ordered are listed, but only abnormal results are displayed) Labs Reviewed  BASIC METABOLIC PANEL - Abnormal; Notable for the following components:      Result Value   Sodium 133 (*)     Chloride 98 (*)    Glucose, Bld 117 (*)    All other components within normal limits  URINALYSIS, ROUTINE W REFLEX MICROSCOPIC - Abnormal; Notable for the following components:   Color, Urine COLORLESS (*)    Specific Gravity, Urine 1.003 (*)    Hgb urine dipstick SMALL (*)    Squamous Epithelial / LPF 0-5 (*)    All other components within normal limits  CBC  I-STAT TROPONIN, ED  I-STAT TROPONIN, ED    EKG  EKG Interpretation  Date/Time:  Thursday April 24 2017 09:37:25 EST Ventricular Rate:  81 PR Interval:  158 QRS Duration: 80 QT Interval:  342 QTC Calculation: 397 R Axis:   35  Text Interpretation:  Normal sinus rhythm no acute ST/T changes no significant change since 2016 Confirmed by Sherwood Gambler [phone]) on 04/24/2017 11:46:09 AM       Radiology Dg Chest 2 View  Result Date: 04/24/2017 CLINICAL DATA:  Mid chest burning sensation and shortness of breath for the past 3 days. Shortness of breath is worse with exertion. Patient also reports dizziness and headache for the past 3 weeks. Never smoked. EXAM: CHEST  2 VIEW COMPARISON:  Chest x-ray of May 30, 2016 FINDINGS: The lungs are mildly hyperinflated and clear. The heart and pulmonary vascularity are normal. The mediastinum is normal in width. There is no pleural effusion. There is calcification in the wall of the thoracic aorta. The bony thorax exhibits no acute abnormality. IMPRESSION: There is no acute cardiopulmonary abnormality. Mild hyperinflation may be voluntary or may reflect chronic bronchitic changes. Electronically Signed   By: David  Martinique M.D.   On: 04/24/2017 10:27    Procedures Procedures (including critical care time)  Medications Ordered in ED Medications  gi cocktail (Maalox,Lidocaine,Donnatal) (30 mLs Oral Given 04/24/17 1319)     Initial Impression / Assessment and Plan / ED Course  I have reviewed the triage vital signs and the nursing notes.  Pertinent labs & imaging results that  were available during my care of the patient were reviewed by me and considered in my medical decision making (see chart for details).     Patient is to be discharged with recommendation to follow up with cardiology in regards to today's hospital visit.  Heart score 4.  Discussed with the patient that we can rule out that she is not actively having a myocardial infarction, but we cannot definitively rule out that the symptoms are not secondary to CAD.  Discussed that we would recommend admission for continued workup, but the patient declines and is adamant on returning home and following up outpatient. VSS, no tracheal deviation, no JVD or new murmur, RRR, breath sounds equal bilaterally, EKG without acute abnormalities, negative troponin, and negative CXR. Pt has been advised to return to the ED if CP becomes exertional, associated with diaphoresis or nausea, radiates to left jaw/arm, worsens or becomes concerning in any way. Pt appears reliable for follow up and is agreeable to discharge.   Case has been discussed with and seen by Dr. Regenia Skeeter who agrees with the above plan.   Final Clinical Impressions(s) / ED Diagnoses   Final diagnoses:  Burning chest pain    ED Discharge Orders        Ordered    sodium chloride (OCEAN) 0.65 % SOLN nasal spray  As needed     04/24/17 1547       Jenaveve Fenstermaker A, PA-C 04/25/17 2033    Sherwood Gambler, MD 04/26/17 2005

## 2017-04-24 NOTE — ED Triage Notes (Addendum)
Pt in from home with c/o "chest burning" x 2 days. Hx of GERD, takes Protonix BID, states burning has been migrating to epigastric region this morning. States she has been feeling warmth in her extremities x 2 days. Afebrile, 98.1. States sob with exertion, sats 98% RA

## 2017-04-24 NOTE — ED Notes (Signed)
Dizziness x a couple of weeks, has seen her dr and no cause has been found chest burning x 3 days , she states when she goes up and down her driveway  The burning is worse and she feels sob, . Marland Kitchen  She forgot to tell her dr about the sob

## 2017-04-24 NOTE — Discharge Instructions (Signed)
Please call to schedule follow-up appointment with cardiology.  Please follow-up with your primary care provider to have your blood pressure rechecked within the next week.  For your nasal congestion, please avoid over-the-counter medications such as Sudafed.  You can try using a saline nasal spray or try using a Nettie pot.  If you develop new or worsening symptoms, including chest pain or pressure, shortness of breath, fever, chills, or other concerning symptoms, please return to the emergency department for reevaluation.

## 2017-04-24 NOTE — ED Notes (Signed)
Pt uip to br requested crackers , ok per dr Regenia Skeeter which was given repeat ekg done and tropoin drawn

## 2017-04-28 ENCOUNTER — Telehealth: Payer: Self-pay | Admitting: Cardiovascular Disease

## 2017-04-28 NOTE — Telephone Encounter (Signed)
Returning your call. °

## 2017-04-28 NOTE — Telephone Encounter (Signed)
[id number] 

## 2017-04-28 NOTE — Telephone Encounter (Signed)
Returned call to patient.She stated she has been having burning and pressure in chest since last Thursday 04/24/17.Stated she has acid reflux but this is different.She went to ED and was told to see cardiologist.She has appointment scheduled with Dr.[place] 05/02/17 at 10:20 am.She refused to see PA sooner.Advised to go to ED if needed.

## 2017-04-28 NOTE — Telephone Encounter (Signed)
Patient calling, states that she is experiencing a "burning sensation" and hot sweats and this happen when patient starts to move around.  Patient went to the ED and the doctors there were not able to find anything and suggested patient come here to have a stress test.  Patient states that BP was low but the pulse rate 90.  Patient is scheduled is to come in on Friday at 10:20 and will leave to go to store at 9:30.

## 2017-04-29 ENCOUNTER — Telehealth: Payer: Self-pay

## 2017-04-30 NOTE — Telephone Encounter (Signed)
Pt states she was at the hospital for 5 hours last Thursday for burning in her chest and in the top of her stomach. She thought it was worse with exertion but states they did not feel it was cardiac related. Reports she is also having a lot of burping. She takes protonix bid and zantac in the evening and still has to chew tums at times. Pt wants to know what else she may try, please advise.

## 2017-05-01 MED ORDER — SUCRALFATE 1 GM/10ML PO SUSP: 1.0000 g | Freq: Three times a day (TID) | ORAL | 5 refills | Status: DC

## 2017-05-01 NOTE — Telephone Encounter (Signed)
The pt has been advised to try the carafate and schedule appt with Dr Ardis Hughs for follow up.  Prescription has been sent and she will call back to set up appt when our schedule is out.

## 2017-05-01 NOTE — Telephone Encounter (Signed)
Please call in carafate susp.  1gram tid with meals, disp one month with 5 refills.  rov with me next available.  Thanks

## 2017-05-02 ENCOUNTER — Ambulatory Visit: Payer: PPO | Admitting: Cardiovascular Disease

## 2017-05-22 DIAGNOSIS — Z0000 Encounter for general adult medical examination without abnormal findings: Secondary | ICD-10-CM | POA: Diagnosis not present

## 2017-05-22 DIAGNOSIS — K219 Gastro-esophageal reflux disease without esophagitis: Secondary | ICD-10-CM | POA: Diagnosis not present

## 2017-05-22 DIAGNOSIS — Z13220 Encounter for screening for lipoid disorders: Secondary | ICD-10-CM | POA: Diagnosis not present

## 2017-05-22 DIAGNOSIS — I10 Essential (primary) hypertension: Secondary | ICD-10-CM | POA: Diagnosis not present

## 2017-05-27 ENCOUNTER — Other Ambulatory Visit: Payer: Self-pay | Admitting: Gastroenterology

## 2017-05-28 ENCOUNTER — Telehealth: Payer: Self-pay | Admitting: Gastroenterology

## 2017-05-28 MED ORDER — PANTOPRAZOLE SODIUM 40 MG PO TBEC: 40.0000 mg | DELAYED_RELEASE_TABLET | Freq: Two times a day (BID) | ORAL | 3 refills | Status: DC

## 2017-05-28 NOTE — Telephone Encounter (Signed)
New prescription sent for BID dosing pt notified.

## 2017-06-19 DIAGNOSIS — Z1231 Encounter for screening mammogram for malignant neoplasm of breast: Secondary | ICD-10-CM | POA: Diagnosis not present

## 2017-06-20 ENCOUNTER — Ambulatory Visit: Payer: PPO | Admitting: Gastroenterology

## 2017-06-26 DIAGNOSIS — E871 Hypo-osmolality and hyponatremia: Secondary | ICD-10-CM | POA: Diagnosis not present

## 2017-06-26 DIAGNOSIS — I10 Essential (primary) hypertension: Secondary | ICD-10-CM | POA: Diagnosis not present

## 2017-07-22 ENCOUNTER — Encounter: Payer: Self-pay | Admitting: Gastroenterology

## 2017-07-22 ENCOUNTER — Ambulatory Visit: Payer: PPO | Admitting: Gastroenterology

## 2017-07-22 VITALS — BP 98/60 | HR 68 | Ht 63.0 in | Wt 117.4 lb

## 2017-07-22 DIAGNOSIS — K219 Gastro-esophageal reflux disease without esophagitis: Secondary | ICD-10-CM | POA: Diagnosis not present

## 2017-07-22 DIAGNOSIS — R1013 Epigastric pain: Secondary | ICD-10-CM

## 2017-07-22 NOTE — Patient Instructions (Signed)
Protonix should be taken 20-30 min before BF meal (once daily). Ranitidine should be taken (150mg  pill) at bedtime nightly. Please return to see Dr. Ardis Hughs in 3-4 months.

## 2017-07-22 NOTE — Progress Notes (Signed)
Review of pertinent gastrointestinal problems:  1. Family history for colon cancer (diagnosed in her late 28s), personal history of tubular adenomas. Last colonoscopy November 2007 by Dr. Lyla Son, tortuous although normal. Next colonoscopy November 2012. Was scheduled for colonoscopy 2012 but she had difficulty with the prep (moviprep) and she canceled it. 10/2011 Colonoscopy Dr. Ardis Hughs with different prep; normal except small hemorrhoids. Recommended no further CRC screening given age. 2. Chronic gastroesophageal reflux disease. Has been on a variety of anti-acid, on a variety of PPIs with mixed results. esophagogastroduodenoscopy January 2005 by Dr. Lyla Son. No esophagitis but a Schatzki's ring was seen. Hyperplastic polyps in the gastric body. EGD February 2008 by Dr. Ardis Hughs was normal except for previously sampled hyperplastic-appearing gastric polyps.  3. Chronic dyspepsia, somewhat GERD like. Repeat EGD Dr. Ardis Hughs 12/2014 found mild gastritis, h. Pylori negative. Follow up CT scan 12/2014 was unrevealing. Improved on omeprazole. 2017/2018 intermittently improves on variety of PPI trials.   HPI: This is a very pleasant 84 year old woman whom I last saw about 8 months ago  I last saw her about 8 months ago.  Her dyspeptic symptoms, that I felt were likely related to GERD were under good control on proton pump inhibitor shortly after her breakfast meal and H2 blocker in the early afternoon.  She called back to the office 2 or 3 times since then and her proton pump inhibitor has been increased to twice daily and Carafate has been added. (she stopped carafate.  Taking protonix before breakfast and one at bedtime.  Still have some mild SOB in AM, she has been ebvaluayted with EKG, testing  Chief complaint is dyspepsia  ROS: complete GI ROS as described in HPI, all other review negative.  Constitutional:  No unintentional weight loss   Past Medical History:  Diagnosis Date  .  Arthritis   . Bladder infection   . Cancer, skin, squamous cell   . Colon polyps   . Frozen shoulder    left   . Gallstones   . GERD (gastroesophageal reflux disease)   . Hiatal hernia   . IBS (irritable bowel syndrome)   . Osteopenia   . Restless leg syndrome     Past Surgical History:  Procedure Laterality Date  . APPENDECTOMY    . CESAREAN SECTION    . CHOLECYSTECTOMY      Current Outpatient Medications  Medication Sig Dispense Refill  . ALPRAZolam (XANAX) 0.25 MG tablet Take 0.125 mg at bedtime by mouth.     Marland Kitchen amLODipine (NORVASC) 2.5 MG tablet Take 2.5 mg by mouth daily.    . Calcium-Vitamin D-Vitamin K (VIACTIV) 659-935-70 MG-UNT-MCG CHEW Chew 2 each by mouth daily.      . ranitidine (ZANTAC) 150 MG capsule Take 150 mg by mouth daily.    . sodium chloride (OCEAN) 0.65 % SOLN nasal spray Place 1 spray as needed into both nostrils for congestion. 30 mL 0  . pantoprazole (PROTONIX) 40 MG tablet Take 1 tablet (40 mg total) by mouth 2 (two) times daily. 60 tablet 3  . sucralfate (CARAFATE) 1 GM/10ML suspension Take 10 mLs (1 g total) 3 (three) times daily by mouth. 420 mL 5   No current facility-administered medications for this visit.     Allergies as of 07/22/2017 - Review Complete 07/22/2017  Allergen Reaction Noted  . Nitroglycerin Other (See Comments) 11/16/2015    Family History  Problem Relation Age of Onset  . Colon cancer Mother 9  . Heart disease Father  MI  . Heart disease Brother   . Lung cancer Brother        x3  . Diabetes Brother   . Hypertension Brother   . Bone cancer Sister   . Lung cancer Sister        remission  . Throat cancer Brother   . Diabetes Maternal Grandmother   . Colon polyps Neg Hx   . Liver disease Neg Hx   . Kidney disease Neg Hx     Social History   Socioeconomic History  . Marital status: Married    Spouse name: Not on file  . Number of children: 2  . Years of education: Not on file  . Highest education  level: Not on file  Social Needs  . Financial resource strain: Not on file  . Food insecurity - worry: Not on file  . Food insecurity - inability: Not on file  . Transportation needs - medical: Not on file  . Transportation needs - non-medical: Not on file  Occupational History  . Occupation: retired  Tobacco Use  . Smoking status: Never Smoker  . Smokeless tobacco: Never Used  Substance and Sexual Activity  . Alcohol use: Yes    Alcohol/week: 0.0 oz    Comment: occasionally wine  . Drug use: No  . Sexual activity: Not on file  Other Topics Concern  . Not on file  Social History Narrative  . Not on file     Physical Exam: BP 98/60   Pulse 68   Ht 5\' 3"  (1.6 m)   Wt 117 lb 6 oz (53.2 kg)   BMI 20.79 kg/m  Constitutional: generally well-appearing Psychiatric: alert and oriented x3 Abdomen: soft, nontender, nondistended, no obvious ascites, no peritoneal signs, normal bowel sounds No peripheral edema noted in lower extremities  Assessment and plan: 84 y.o. female with persistent dyspepsia, likely GERD related  I am going to adjust her antiacid medicine regimen again.  She will be taking proton pump inhibitor shortly before breakfast meal on a daily basis and she will take H2 blocker at bedtime nightly.  I do not think she needs any other testing.  She will return to see me in 3-4 months and sooner if needed  Please see the "Patient Instructions" section for addition details about the plan.  Owens Loffler, MD [facility] 07/22/2017, 9:50 AM

## 2017-08-01 DIAGNOSIS — L821 Other seborrheic keratosis: Secondary | ICD-10-CM | POA: Diagnosis not present

## 2017-08-01 DIAGNOSIS — D485 Neoplasm of uncertain behavior of skin: Secondary | ICD-10-CM | POA: Diagnosis not present

## 2017-08-01 DIAGNOSIS — L74513 Primary focal hyperhidrosis, soles: Secondary | ICD-10-CM | POA: Diagnosis not present

## 2017-08-01 DIAGNOSIS — D0462 Carcinoma in situ of skin of left upper limb, including shoulder: Secondary | ICD-10-CM | POA: Diagnosis not present

## 2017-08-01 DIAGNOSIS — L723 Sebaceous cyst: Secondary | ICD-10-CM | POA: Diagnosis not present

## 2017-08-01 DIAGNOSIS — C44619 Basal cell carcinoma of skin of left upper limb, including shoulder: Secondary | ICD-10-CM | POA: Diagnosis not present

## 2017-08-01 DIAGNOSIS — Z23 Encounter for immunization: Secondary | ICD-10-CM | POA: Diagnosis not present

## 2017-08-01 DIAGNOSIS — D225 Melanocytic nevi of trunk: Secondary | ICD-10-CM | POA: Diagnosis not present

## 2017-08-01 DIAGNOSIS — Z85828 Personal history of other malignant neoplasm of skin: Secondary | ICD-10-CM | POA: Diagnosis not present

## 2017-08-29 ENCOUNTER — Ambulatory Visit (INDEPENDENT_AMBULATORY_CARE_PROVIDER_SITE_OTHER): Payer: PPO | Admitting: Orthopedic Surgery

## 2017-08-29 ENCOUNTER — Ambulatory Visit (INDEPENDENT_AMBULATORY_CARE_PROVIDER_SITE_OTHER): Payer: PPO

## 2017-08-29 ENCOUNTER — Encounter (INDEPENDENT_AMBULATORY_CARE_PROVIDER_SITE_OTHER): Payer: Self-pay | Admitting: Orthopedic Surgery

## 2017-08-29 DIAGNOSIS — M542 Cervicalgia: Secondary | ICD-10-CM | POA: Diagnosis not present

## 2017-08-29 DIAGNOSIS — M25512 Pain in left shoulder: Secondary | ICD-10-CM

## 2017-08-30 ENCOUNTER — Encounter (INDEPENDENT_AMBULATORY_CARE_PROVIDER_SITE_OTHER): Payer: Self-pay | Admitting: Orthopedic Surgery

## 2017-08-30 DIAGNOSIS — M542 Cervicalgia: Secondary | ICD-10-CM | POA: Diagnosis not present

## 2017-08-30 DIAGNOSIS — M25512 Pain in left shoulder: Secondary | ICD-10-CM | POA: Diagnosis not present

## 2017-08-30 MED ORDER — LIDOCAINE HCL 1 % IJ SOLN
5.0000 mL | INTRAMUSCULAR | Status: AC | PRN
  Administered 2017-08-30: 5 mL

## 2017-08-30 MED ORDER — METHYLPREDNISOLONE ACETATE 40 MG/ML IJ SUSP
40.0000 mg | INTRAMUSCULAR | Status: AC | PRN
  Administered 2017-08-30: 40 mg via INTRA_ARTICULAR

## 2017-08-30 MED ORDER — BUPIVACAINE HCL 0.5 % IJ SOLN
9.0000 mL | INTRAMUSCULAR | Status: AC | PRN
  Administered 2017-08-30: 9 mL via INTRA_ARTICULAR

## 2017-08-30 NOTE — Progress Notes (Signed)
Office Visit Note   Patient: Madison [name]           Date of Birth: 15-Jun-1934           MRN: [id number] Visit Date: 08/29/2017 Requested by: Madison [name]., PA-C [address], [place] [address] PCP: Madison [name]., PA-C  Subjective: Chief Complaint  Patient presents with  . Left Shoulder - Pain  . Neck - Pain    HPI: [name] is a patient with left arm and shoulder pain and neck pain.  She states her arm is getting very tired.  This all started 2 weeks ago after doing some raking.  Have a discrete injury.  She reports some neck pain as well as some new numbness and tingling in the right hand.  She is right-hand dominant.  Her left arm and shoulder is hurting.  She takes Tylenol without relief.  4 years ago she had epidural steroid injections into her lumbar spine.  Has not had prior left shoulder surgery but does report having some injections in the left shoulder years ago.  She states she cannot lift anything with her left arm.              ROS: All systems reviewed are negative as they relate to the chief complaint within the history of present illness.  Patient denies  fevers or chills.   Assessment & Plan: Visit Diagnoses:  1. Neck pain   2. Acute pain of left shoulder     Plan: Impression is left shoulder pain with weakness and some weakness to biceps strength testing on the left as well as weakness to external rotation strength testing on the left.  Based on her whole arm symptoms I think this could represent cervical spine pathology and early left-sided C5 radiculopathy.  Alternatively this may be rotator cuff arthropathy although her biceps weakness does not really go along with that.  Plan is subacromial injection as this has helped similar symptoms for her in the past years ago.  She also needs MRI cervical spine because of the weakness in the arm to evaluate for radiculopathy.  I will see her back after that study  Follow-Up Instructions: Return for  after MRI.   Orders:  Orders Placed This Encounter  Procedures  . XR Shoulder Left  . XR Cervical Spine 2 or 3 views  . MR Cervical Spine w/o contrast   No orders of the defined types were placed in this encounter.     Procedures: Large Joint Inj: L subacromial bursa on 08/30/2017 6:04 PM Indications: diagnostic evaluation and pain Details: 18 G 1.5 in needle, posterior approach  Arthrogram: No  Medications: 9 mL bupivacaine 0.5 %; 40 mg methylPREDNISolone acetate 40 MG/ML; 5 mL lidocaine 1 % Outcome: tolerated well, no immediate complications Procedure, treatment alternatives, risks and benefits explained, specific risks discussed. Consent was given by the patient. Immediately prior to procedure a time out was called to verify the correct patient, procedure, equipment, support staff and site/side marked as required. Patient was prepped and draped in the usual sterile fashion.       Clinical Data: No additional findings.  Objective: Vital Signs: There were no vitals taken for this visit.  Physical Exam:   Constitutional: Patient appears well-developed HEENT:  Head: Normocephalic Eyes:EOM are normal Neck: Normal range of motion Cardiovascular: Normal rate Pulmonary/chest: Effort normal Neurologic: Patient is alert Skin: Skin is warm Psychiatric: Patient has normal mood and affect   Ortho  Exam: Orthopedic exam demonstrates that left shoulder functional deltoid but weakness with biceps resistance testing compared to the right-hand side.  She also has weakness to infraspinatus external rotation strength testing left versus right.  Symmetric grip EPL FPL interosseous wrist flexion wrist extension and triceps testing is present.  Deltoid also fires bilaterally.  She does not have active forward flexion and abduction above 90 degrees on the left but she does on the right.  Cervical spine range of motion is slightly tender with rotation to the left.  Reflexes symmetric 0 to 1+  out of 4 bilateral biceps and triceps.  Radial pulse intact bilaterally.  Specialty Comments:  No specialty comments available.  Imaging: No results found.   PMFS History: Patient Active Problem List   Diagnosis Date Noted  . CLAUDICATION 08/31/2010  . CHEST PAIN 08/31/2010  . GERD 10/14/2007   Past Medical History:  Diagnosis Date  . Arthritis   . Bladder infection   . Cancer, skin, squamous cell   . Colon polyps   . Frozen shoulder    left   . Gallstones   . GERD (gastroesophageal reflux disease)   . Hiatal hernia   . IBS (irritable bowel syndrome)   . Osteopenia   . Restless leg syndrome     Family History  Problem Relation Age of Onset  . Colon cancer Mother 53  . Heart disease Father        MI  . Heart disease Brother   . Lung cancer Brother        x3  . Diabetes Brother   . Hypertension Brother   . Bone cancer Sister   . Lung cancer Sister        remission  . Throat cancer Brother   . Diabetes Maternal Grandmother   . Colon polyps Neg Hx   . Liver disease Neg Hx   . Kidney disease Neg Hx     Past Surgical History:  Procedure Laterality Date  . APPENDECTOMY    . CESAREAN SECTION    . CHOLECYSTECTOMY     Social History   Occupational History  . Occupation: retired  Tobacco Use  . Smoking status: Never Smoker  . Smokeless tobacco: Never Used  Substance and Sexual Activity  . Alcohol use: Yes    Alcohol/week: 0.0 oz    Comment: occasionally wine  . Drug use: No  . Sexual activity: Not on file

## 2017-09-07 DIAGNOSIS — N390 Urinary tract infection, site not specified: Secondary | ICD-10-CM | POA: Diagnosis not present

## 2017-09-09 ENCOUNTER — Ambulatory Visit
Admission: RE | Admit: 2017-09-09 | Discharge: 2017-09-09 | Disposition: A | Discharge status: Home / Self Care | Payer: PPO | Source: Ambulatory Visit | Attending: Orthopedic Surgery | Admitting: Orthopedic Surgery

## 2017-09-09 DIAGNOSIS — M542 Cervicalgia: Secondary | ICD-10-CM | POA: Diagnosis not present

## 2017-09-16 ENCOUNTER — Other Ambulatory Visit: Payer: Self-pay | Admitting: Gastroenterology

## 2017-09-17 ENCOUNTER — Encounter (INDEPENDENT_AMBULATORY_CARE_PROVIDER_SITE_OTHER): Payer: Self-pay | Admitting: Orthopedic Surgery

## 2017-09-17 ENCOUNTER — Ambulatory Visit (INDEPENDENT_AMBULATORY_CARE_PROVIDER_SITE_OTHER): Payer: PPO | Admitting: Orthopedic Surgery

## 2017-09-17 DIAGNOSIS — M79641 Pain in right hand: Secondary | ICD-10-CM | POA: Diagnosis not present

## 2017-09-17 DIAGNOSIS — M542 Cervicalgia: Secondary | ICD-10-CM

## 2017-09-17 DIAGNOSIS — M79642 Pain in left hand: Secondary | ICD-10-CM

## 2017-09-19 ENCOUNTER — Encounter (INDEPENDENT_AMBULATORY_CARE_PROVIDER_SITE_OTHER): Payer: Self-pay | Admitting: Orthopedic Surgery

## 2017-09-19 NOTE — Progress Notes (Signed)
Office Visit Note   Patient: Madison [name]           Date of Birth: 02/05/1934           MRN: [id number] Visit Date: 09/17/2017 Requested by: Aletha Halim., PA-C [address] [place] [address] PCP: Aletha Halim., PA-C  Subjective: Chief Complaint  Patient presents with  . Neck - Follow-up    HPI: Madison [name] presents for review of cervical spine MRI.  She states that her shoulder was better after about 4 days.  Both hands are still going to sleep.  She had neck pain last night.  MRI scan shows left-sided nerve root impingement at C2-3 C4-5 and C6-7 and right neuroforaminal impingement at C4 through 4 and C5-6.  She does have a history of 3 epidural steroid injections in the lumbar spine.  She reports primarily palmar more than dorsal sided numbness and tingling.              ROS: All systems reviewed are negative as they relate to the chief complaint within the history of present illness.  Patient denies  fevers or chills.   Assessment & Plan: Visit Diagnoses:  1. Neck pain   2. Bilateral hand pain     Plan: Impression is bilateral hand numbness and tingling possibly consistent with carpal tunnel syndrome along with nerve root impingement in the cervical spine.  Plan is referral to Dr. Ernestina Patches for cervical spine ESI as well as nerve conduction study for bilateral hand carpal tunnel syndrome.  We will see where the nerve compression is neck versus hand and possibly both.  Follow-Up Instructions: No follow-ups on file.   Orders:  Orders Placed This Encounter  Procedures  . Ambulatory referral to Physical Medicine Rehab   No orders of the defined types were placed in this encounter.     Procedures: No procedures performed   Clinical Data: No additional findings.  Objective: Vital Signs: There were no vitals taken for this visit.  Physical Exam:   Constitutional: Patient appears well-developed HEENT:  Head: Normocephalic Eyes:EOM are  normal Neck: Normal range of motion Cardiovascular: Normal rate Pulmonary/chest: Effort normal Neurologic: Patient is alert Skin: Skin is warm Psychiatric: Patient has normal mood and affect    Ortho Exam: Orthopedic exam demonstrates reasonable cervical spine range of motion with some loss of full extension.  Patient has 5+ out of 5 grip EPL FPL interosseous wrist flexion wrist extension biceps triceps and deltoid strength.  No paresthesias L1 S1 bilaterally.  Negative Tinel's cubital tunnel.  Wrist elbow shoulder range of motion full.  Cervical spine range of motion generally intact except for full extension.  No muscle atrophy in the arms.  Specialty Comments:  No specialty comments available.  Imaging: No results found.   PMFS History: Patient Active Problem List   Diagnosis Date Noted  . CLAUDICATION 08/31/2010  . CHEST PAIN 08/31/2010  . GERD 10/14/2007   Past Medical History:  Diagnosis Date  . Arthritis   . Bladder infection   . Cancer, skin, squamous cell   . Colon polyps   . Frozen shoulder    left   . Gallstones   . GERD (gastroesophageal reflux disease)   . Hiatal hernia   . IBS (irritable bowel syndrome)   . Osteopenia   . Restless leg syndrome     Family History  Problem Relation Age of Onset  . Colon cancer Mother 2  . Heart disease Father  MI  . Heart disease Brother   . Lung cancer Brother        x3  . Diabetes Brother   . Hypertension Brother   . Bone cancer Sister   . Lung cancer Sister        remission  . Throat cancer Brother   . Diabetes Maternal Grandmother   . Colon polyps Neg Hx   . Liver disease Neg Hx   . Kidney disease Neg Hx     Past Surgical History:  Procedure Laterality Date  . APPENDECTOMY    . CESAREAN SECTION    . CHOLECYSTECTOMY     Social History   Occupational History  . Occupation: retired  Tobacco Use  . Smoking status: Never Smoker  . Smokeless tobacco: Never Used  Substance and Sexual Activity   . Alcohol use: Yes    Alcohol/week: 0.0 oz    Comment: occasionally wine  . Drug use: No  . Sexual activity: Not on file

## 2017-09-25 DIAGNOSIS — D485 Neoplasm of uncertain behavior of skin: Secondary | ICD-10-CM | POA: Diagnosis not present

## 2017-09-25 DIAGNOSIS — L905 Scar conditions and fibrosis of skin: Secondary | ICD-10-CM | POA: Diagnosis not present

## 2017-09-25 DIAGNOSIS — C44619 Basal cell carcinoma of skin of left upper limb, including shoulder: Secondary | ICD-10-CM | POA: Diagnosis not present

## 2017-09-25 DIAGNOSIS — C44329 Squamous cell carcinoma of skin of other parts of face: Secondary | ICD-10-CM | POA: Diagnosis not present

## 2017-10-07 ENCOUNTER — Telehealth: Payer: Self-pay | Admitting: Gastroenterology

## 2017-10-07 NOTE — Telephone Encounter (Signed)
The pt states she had a return of symptoms (GERD) after stopping BID dosing of protonix and zantac at bedtime.  She was advised to resume BID protonix and zantac at bedtime and was given anti reflux precautions.  She has an appt to f/u and will keep that as scheduled and will call back if needed.

## 2017-10-17 ENCOUNTER — Encounter (INDEPENDENT_AMBULATORY_CARE_PROVIDER_SITE_OTHER): Payer: Self-pay | Admitting: Physical Medicine and Rehabilitation

## 2017-10-18 DIAGNOSIS — N390 Urinary tract infection, site not specified: Secondary | ICD-10-CM | POA: Diagnosis not present

## 2017-10-27 DIAGNOSIS — G2581 Restless legs syndrome: Secondary | ICD-10-CM | POA: Diagnosis not present

## 2017-10-27 DIAGNOSIS — N951 Menopausal and female climacteric states: Secondary | ICD-10-CM | POA: Diagnosis not present

## 2017-10-27 DIAGNOSIS — N390 Urinary tract infection, site not specified: Secondary | ICD-10-CM | POA: Diagnosis not present

## 2017-11-12 ENCOUNTER — Encounter (INDEPENDENT_AMBULATORY_CARE_PROVIDER_SITE_OTHER): Payer: Self-pay | Admitting: Physical Medicine and Rehabilitation

## 2017-11-13 DIAGNOSIS — C44329 Squamous cell carcinoma of skin of other parts of face: Secondary | ICD-10-CM | POA: Diagnosis not present

## 2017-11-17 ENCOUNTER — Encounter: Payer: Self-pay | Admitting: Gastroenterology

## 2017-11-17 ENCOUNTER — Ambulatory Visit: Payer: PPO | Admitting: Gastroenterology

## 2017-11-17 VITALS — BP 126/70 | HR 74 | Ht 64.5 in | Wt 119.2 lb

## 2017-11-17 DIAGNOSIS — R1013 Epigastric pain: Secondary | ICD-10-CM | POA: Diagnosis not present

## 2017-11-17 DIAGNOSIS — R490 Dysphonia: Secondary | ICD-10-CM

## 2017-11-17 NOTE — Patient Instructions (Addendum)
If your voice is still raspy in 2 weeks, please call back for ENT referral. Stay on protonix twice daily (best 20-30 min before meals) and zantac at bedtime  Normal BMI (Body Mass Index- based on height and weight) is between 23 and 30. Your BMI today is Body mass index is 20.15 kg/m. Marland Kitchen Please consider follow up  regarding your BMI with your Primary Care Provider. Marland Kitchen

## 2017-11-17 NOTE — Progress Notes (Signed)
Review of pertinent gastrointestinal problems:  1. Family history for colon cancer (diagnosed in her late 76s), personal history of tubular adenomas. Last colonoscopy November 2007 by Dr. Lyla Son, tortuous although normal. Next colonoscopy November 2012. Was scheduled for colonoscopy 2012 but she had difficulty with the prep (moviprep) and she canceled it. 10/2011 Colonoscopy Dr. Ardis Hughs with different prep; normal except small hemorrhoids. Recommended no further CRC screening given age. 2. Chronic gastroesophageal reflux disease. Has been on a variety of anti-acid, on a variety of PPIs with mixed results. esophagogastroduodenoscopy January 2005 by Dr. Lyla Son. No esophagitis but a Schatzki's ring was seen. Hyperplastic polyps in the gastric body. EGD February 2008 by Dr. Ardis Hughs was normal except for previously sampled hyperplastic-appearing gastric polyps.  3. Chronic dyspepsia, somewhat GERD like. Repeat EGD Dr. Ardis Hughs 12/2014 found mild gastritis, h. Pylori negative. Follow up CT scan 12/2014 was unrevealing. Improved on omeprazole.2017/2018 intermittently improves on variety of PPI trials.    HPI: This is a very pleasant 84 year old woman whom I last saw about 4 months ago  I last saw her about 4 months ago for persistent dyspepsia that I felt was likely GERD related.  I adjusted her antiacid medicines so that she would be taking proton pump inhibitor shortly before her breakfast meal and an H2 blocker at bedtime nightly.  She did that regimen for a while.  Currently protonix 40 min before BF.  Zantac at bedtime.  Also protonix usually before dinner.  Stomach overall feels better.  No stomach aching.  Never dysphagia.  Raspy voice lately; in past 3-4 weeks. Coughing a bit.  Has been congested at times   She eats a small bowel of cereal after dinner usually.   Rare caffeine.    Chief complaint is burning stomach, raspy voice  ROS: complete GI ROS as described in HPI, all  other review negative.  Constitutional:  No unintentional weight loss (weight is up 2 pounds since last visit)   Past Medical History:  Diagnosis Date  . Arthritis   . Bladder infection   . Cancer, skin, squamous cell   . Colon polyps   . Frozen shoulder    left   . Gallstones   . GERD (gastroesophageal reflux disease)   . Hiatal hernia   . IBS (irritable bowel syndrome)   . Osteopenia   . Restless leg syndrome     Past Surgical History:  Procedure Laterality Date  . APPENDECTOMY    . CESAREAN SECTION    . CHOLECYSTECTOMY    . MOHS SURGERY Left    scalp- SCC    Current Outpatient Medications  Medication Sig Dispense Refill  . ALPRAZolam (XANAX) 0.25 MG tablet Take 0.125 mg at bedtime by mouth.     Marland Kitchen amLODipine (NORVASC) 2.5 MG tablet Take 2.5 mg by mouth daily.    . Calcium-Vitamin D-Vitamin K (VIACTIV) 426-834-19 MG-UNT-MCG CHEW Chew 2 each by mouth daily.      . pantoprazole (PROTONIX) 40 MG tablet TAKE 1 TABLET BY MOUTH TWICE A DAY 60 tablet 3  . ranitidine (ZANTAC) 150 MG capsule Take 150 mg by mouth daily.     No current facility-administered medications for this visit.     Allergies as of 11/17/2017 - Review Complete 11/17/2017  Allergen Reaction Noted  . Nitroglycerin Other (See Comments) 11/16/2015    Family History  Problem Relation Age of Onset  . Colon cancer Mother 52  . Heart disease Father  MI  . Heart disease Brother   . Lung cancer Brother        x3  . Diabetes Brother   . Hypertension Brother   . Bone cancer Sister   . Lung cancer Sister        remission  . Throat cancer Brother   . Diabetes Maternal Grandmother   . Colon polyps Neg Hx   . Liver disease Neg Hx   . Kidney disease Neg Hx     Social History   Socioeconomic History  . Marital status: Married    Spouse name: Not on file  . Number of children: 2  . Years of education: Not on file  . Highest education level: Not on file  Occupational History  . Occupation:  retired  Scientific laboratory technician  . Financial resource strain: Not on file  . Food insecurity:    Worry: Not on file    Inability: Not on file  . Transportation needs:    Medical: Not on file    Non-medical: Not on file  Tobacco Use  . Smoking status: Never Smoker  . Smokeless tobacco: Never Used  Substance and Sexual Activity  . Alcohol use: Yes    Alcohol/week: 0.0 oz    Comment: occasionally wine  . Drug use: No  . Sexual activity: Not on file  Lifestyle  . Physical activity:    Days per week: Not on file    Minutes per session: Not on file  . Stress: Not on file  Relationships  . Social connections:    Talks on phone: Not on file    Gets together: Not on file    Attends religious service: Not on file    Active member of club or organization: Not on file    Attends meetings of clubs or organizations: Not on file    Relationship status: Not on file  . Intimate partner violence:    Fear of current or ex partner: Not on file    Emotionally abused: Not on file    Physically abused: Not on file    Forced sexual activity: Not on file  Other Topics Concern  . Not on file  Social History Narrative  . Not on file     Physical Exam: BP 126/70   Pulse 74   Ht 5' 4.5" (1.638 m)   Wt 119 lb 4 oz (54.1 kg)   BMI 20.15 kg/m  Constitutional: generally well-appearing Psychiatric: alert and oriented x3 Abdomen: soft, nontender, nondistended, no obvious ascites, no peritoneal signs, normal bowel sounds No peripheral edema noted in lower extremities  Assessment and plan: 84 y.o. female with dyspepsia, GERD-like and also raspy voice that is new  First her GERD-like dyspepsia seems to be improved on acid control.  She will continue taking proton pump inhibitor twice daily and Zantac at bedtime.  She often forgets the evening dose of proton pump inhibitor but that is okay.  She has been bothered by a raspy voice over the past 3 or 4 weeks.  It sounds like it may be allergy related.  She  has felt some postnasal drip and some nasal congestion.  It is certainly a significant pollen season we are having this year.  I recommended that if her raspy voice does not get much better over the next couple weeks that she call back and I will arrange for an ear nose and throat referral.  I do not think that it is acid related.  She is  really on maximum antiacid medical control now.  Please see the "Patient Instructions" section for addition details about the plan.  Owens Loffler, MD [facility] 11/17/2017, 1:59 PM

## 2017-11-19 ENCOUNTER — Encounter (INDEPENDENT_AMBULATORY_CARE_PROVIDER_SITE_OTHER): Payer: Self-pay | Admitting: Physical Medicine and Rehabilitation

## 2017-12-04 ENCOUNTER — Telehealth: Payer: Self-pay | Admitting: Gastroenterology

## 2017-12-04 NOTE — Telephone Encounter (Signed)
Pt called stating that Dr. Ardis Hughs was going to referred her to ENT if her voice was still raspy, pt said that sxs are getting worse and she would like to see ENT. She will not be available for ENT appt on 12/11/17 and the first week in August. Pls call pt.

## 2017-12-04 NOTE — Telephone Encounter (Signed)
Referral faxed to Dr. Radene Journey. Pt aware and knows to expect a call from their office regarding the appt.

## 2017-12-04 NOTE — Telephone Encounter (Signed)
Dr. Ardis Hughs please see note below and advise regarding ENT referral.

## 2017-12-04 NOTE — Telephone Encounter (Signed)
Ok, please refer to ENT for raspy voice, currently on maximum medical anti-acid therapy   Thanks

## 2017-12-05 DIAGNOSIS — R490 Dysphonia: Secondary | ICD-10-CM | POA: Diagnosis not present

## 2017-12-05 DIAGNOSIS — J310 Chronic rhinitis: Secondary | ICD-10-CM | POA: Diagnosis not present

## 2017-12-05 DIAGNOSIS — R5383 Other fatigue: Secondary | ICD-10-CM | POA: Diagnosis not present

## 2017-12-11 ENCOUNTER — Encounter (INDEPENDENT_AMBULATORY_CARE_PROVIDER_SITE_OTHER): Payer: Self-pay | Admitting: Physical Medicine and Rehabilitation

## 2017-12-11 ENCOUNTER — Ambulatory Visit (INDEPENDENT_AMBULATORY_CARE_PROVIDER_SITE_OTHER): Payer: PPO | Admitting: Physical Medicine and Rehabilitation

## 2017-12-11 DIAGNOSIS — M542 Cervicalgia: Secondary | ICD-10-CM

## 2017-12-11 DIAGNOSIS — R531 Weakness: Secondary | ICD-10-CM | POA: Diagnosis not present

## 2017-12-11 DIAGNOSIS — R202 Paresthesia of skin: Secondary | ICD-10-CM | POA: Diagnosis not present

## 2017-12-11 DIAGNOSIS — M79602 Pain in left arm: Secondary | ICD-10-CM

## 2017-12-11 NOTE — Progress Notes (Signed)
 .  Numeric Pain Rating Scale and Functional Assessment Average Pain 8   In the last MONTH (on 0-10 scale) has pain interfered with the following?  1. General activity like being  able to carry out your everyday physical activities such as walking, climbing stairs, carrying groceries, or moving a chair?  Rating(5)    

## 2017-12-12 ENCOUNTER — Encounter (INDEPENDENT_AMBULATORY_CARE_PROVIDER_SITE_OTHER): Payer: Self-pay | Admitting: Physical Medicine and Rehabilitation

## 2017-12-12 NOTE — Progress Notes (Signed)
Madison [name] - 84 y.o. female MRN [id number]  Date of birth: Feb 09, 1934  Office Visit Note: Visit Date: 12/11/2017 PCP: Aletha Halim., PA-C Referred by: Aletha Halim., PA-C  Subjective: Chief Complaint  Patient presents with  . Neck - Pain  . Right Arm - Pain  . Right Hand - Tingling  . Left Hand - Tingling   HPI: Madison [name] is an 84 year old right-hand-dominant female that comes in today at the request of Dr. Anderson Malta for evaluation and management and likely electrodiagnostic study of the left upper extremity to to worsening history of left shoulder pain and arm weakness.  She tells me that early in March she was doing some raking outside and sometime after that noticed in the morning that her left shoulder and arm was very weak and she really had a hard time holding it up.  She reports she actually had to have her husband hold it up for her as it felt so weak.  This is more of a global weakness with shoulder abduction and extension flexion.  She did report some shoulder pain and some neck pain in general.  She really did not do much at that time except rested and to see what would happen.  She then went on to have some tingling first in the right hand but then the left hand as well.  The tingling in the hands are all digits globally and nondermatomal only.  She reports now that on the left side the pain has decreased quite a bit and she does have strength back in the left arm where she cannot raise her arm and actually holds it in place on her own.  She really did not have any symptoms affecting the right side except tingling in the hand.  She does report some pain when she tries to reach and grab things and move the left shoulder.  She does not endorse any symptoms down the arm into the hand on the left side.  She rates her pain still is an 8 out of 10 however.  When you talk to her though she says is not much pain is more of the weakness feeling.  Nonetheless she  initially saw Dr. Marlou Sa and felt like maybe this was a rotator cuff problem versus a radiculopathy.  He did complete subacromial injection that seemed to help for 3 to 4 days but then he did get an MRI of the cervical spine.  The MRI of the cervical spine is reviewed with the patient today and reviewed below.  She has multilevel findings of facet arthropathy and uncinate spurring given some ice to foraminal narrowing.  She does not have much in the way of central stenosis or focal herniation or focal nerve root entrapment.  She has had no prior electrodiagnostic study or cervical injection or cervical surgery.  I have seen her in the past for epidural injections for her lumbar spine some years ago.  Course is complicated by history of some anxiety with the use of Xanax as needed.  No history of fibromyalgia.   Review of Systems  Constitutional: Negative for chills, fever, malaise/fatigue and weight loss.  HENT: Negative for hearing loss and sinus pain.   Eyes: Negative for blurred vision, double vision and photophobia.  Respiratory: Negative for cough and shortness of breath.   Cardiovascular: Negative for chest pain, palpitations and leg swelling.  Gastrointestinal: Negative for abdominal pain, nausea and vomiting.  Genitourinary: Negative for flank  pain.  Musculoskeletal: Positive for back pain, joint pain and neck pain. Negative for myalgias.  Skin: Negative for itching and rash.  Neurological: Positive for tingling. Negative for tremors, focal weakness and weakness.  Endo/Heme/Allergies: Negative.   Psychiatric/Behavioral: Negative for depression.  All other systems reviewed and are negative.  Otherwise per HPI.  Assessment & Plan: Visit Diagnoses:  1. Paresthesia of skin   2. Cervicalgia   3. Left arm pain   4. Weakness     Plan: Findings:  Chronic since early March left shoulder pain with some weakness or inability to hold the arm up which is significantly improved this point.  She has  bilateral tingling in the hands which is nondermatomal.  Electrodiagnostic study as seen below was normal.  Obviously with a normal electrodiagnostic study it does pretty effectively rule out carpal tunnel issues but does not rule out cervical radiculitis or myofascial pain syndrome.  Her situation is interesting that her left shoulder seem to have gotten better for a few days with subacromial injection.  It seems to be more mechanical shoulder pain with movement more than aching pain from a nerve root although again cannot rule this out.  At this point she feels comfortable watching and just seeing how things go if she is shown some much improvement.  I would have her see Dr. Marlou Sa again for her shoulder to her shoulder pain return as I do not think it is worth the risk of cervical injection and the patient does not like the idea of cervical injection after hearing about the risk involved.  I did reassure her that we can do this without problem if it needed to be done but again she also stay on the conservative side of things.  In terms of the tingling in the hands I did have a good answer for her other than the nerve test was normal.  There could be some level of polyneuropathy that we just cannot detect is giving her some tingling.  There could be a median nerve neuritis that is just not showing up on the test.  Again for right now she is is going to watch and wait and see how things progress.  The nerve test itself is reassuring.  In terms of the MRI of the cervical spine there is by foraminal narrowing at different places do uncinate spurring and foraminal narrowing but these are so fairly diffuse that she is only having symptoms really on the left side I do not think that that is the source of the hand tingling but again cannot totally ruled out.   Impression: Essentially NORMAL electrodiagnostic study of the left upper limb.    There is no significant electrodiagnostic evidence of nerve entrapment,  brachial plexopathy or cervical radiculopathy.    As you know, purely sensory or demyelinating radiculopathies and chemical radiculitis may not be detected with this particular electrodiagnostic study.  This electrodiagnostic study cannot rule out small fiber polyneuropathy and dysesthesias from central pain sensitization syndromes such as fibromyalgia.  Myotomal referral pain from trigger points is also not excluded.   Recommendations: 1.  Follow-up with referring physician. 2.  Continue current management of symptoms.    Meds & Orders: No orders of the defined types were placed in this encounter.   Orders Placed This Encounter  Procedures  . NCV with EMG (electromyography)    Follow-up: Return if symptoms worsen or fail to improve, for Dr. Marlou Sa for orhtopedic complaints.   Procedures: No procedures performed  No notes on file   Clinical History: MRI CERVICAL SPINE WITHOUT CONTRAST  TECHNIQUE: Multiplanar, multisequence MR imaging of the cervical spine was performed. No intravenous contrast was administered.  COMPARISON:  Two-view cervical spine, 08/29/2017.  FINDINGS: Alignment: 2 mm anterolisthesis C4-5, otherwise anatomic.  Vertebrae: No worrisome osseous lesion. Bone marrow edema related to C4-5 facet arthropathy on the LEFT.  Cord: No significant cord compression or abnormal cord signal.  Posterior Fossa, vertebral arteries, paraspinal tissues: Unremarkable.  Disc levels:  C2-3: Central and rightward osseous spurring/annular bulge. Facet arthropathy on the LEFT. LEFT C3 foraminal narrowing.  C3-4: Focal uncinate spur to the RIGHT. BILATERAL facet arthropathy, worse on the LEFT. RIGHT greater than LEFT C4 foraminal narrowing. Effacement anterior subarachnoid space, minimal RIGHT-sided cord flattening.  C4-5: 2 mm anterolisthesis. Advanced LEFT-sided facet arthropathy. Annular bulge. LEFT greater than RIGHT C5 foraminal narrowing. Effacement  anterior subarachnoid space, without significant cord flattening.  C5-6: Disc space narrowing. Central protrusion extends more to the RIGHT than LEFT. Slight effacement anterior subarachnoid space without significant cord flattening. RIGHT greater than LEFT C6 foraminal narrowing.  C6-7: Disc space narrowing. Annular bulge, osseous spurring extends to the LEFT. LEFT C7 foraminal narrowing.  C7-T1:  Unremarkable.  IMPRESSION: Potentially symptomatic LEFT-sided neural impingement at C2-3, C4-5, and C6-7  Significant foraminal narrowing on the RIGHT at C3-4 and C5-6.  Mild multilevel stenosis, without significant cord flattening or abnormal cord signal.  If further investigation desired, with regard to the relative contributions of each level to the patient's pain, consider cervical myelogram and postmyelogram CT.   Electronically Signed   By: Staci Righter M.D.   On: 09/09/2017 11:23   She reports that she has never smoked. She has never used smokeless tobacco. No results for input(s): HGBA1C, LABURIC in the last 8760 hours.  Objective:  VS:  HT:    WT:   BMI:     BP:   HR: bpm  TEMP: ( )  RESP:  Physical Exam  Constitutional: She is oriented to person, place, and time. She appears well-developed and well-nourished. No distress.  HENT:  Head: Normocephalic and atraumatic.  Nose: Nose normal.  Mouth/Throat: Oropharynx is clear and moist.  Eyes: Pupils are equal, round, and reactive to light. Conjunctivae are normal.  Neck: Neck supple. No JVD present. No tracheal deviation present.  Cardiovascular: Regular rhythm and intact distal pulses.  Pulmonary/Chest: Effort normal. No respiratory distress.  Abdominal: She exhibits no distension. There is no guarding.  Musculoskeletal:  Examination of the cervical spine shows forward flexed cervical spine with myofascial trigger points and levator scapula and trapezius.  She seems to have some impingement of the left  shoulder with external rotation and abduction.  She has less symptoms on the right.  Examination of both upper extremity shows good strength bilaterally with wrist extension and long finger flexion as well as finger abduction.  She has a negative Hoffman's test bilaterally.  She has intact sensation in all dermatomal and peripheral nerve distributions.  Neurological: She is alert and oriented to person, place, and time. She exhibits normal muscle tone. Coordination normal.  Skin: Skin is warm. No rash noted. No erythema.  Psychiatric: She has a normal mood and affect. Her behavior is normal.  Nursing note and vitals reviewed.   Ortho Exam Imaging: No results found.  Past Medical/Family/Surgical/Social History: Medications & Allergies reviewed per EMR, new medications updated. Patient Active Problem List   Diagnosis Date Noted  . Raspy voice 12/05/2017  . Urinary  tract infectious disease 04/21/2017  . Anxiety 10/17/2015  . Chronic rhinitis 10/17/2015  . Fatigue 10/17/2015  . CLAUDICATION 08/31/2010  . CHEST PAIN 08/31/2010  . Claudication ([place]) 08/31/2010  . GERD 10/14/2007   Past Medical History:  Diagnosis Date  . Arthritis   . Bladder infection   . Cancer, skin, squamous cell   . Colon polyps   . Frozen shoulder    left   . Gallstones   . GERD (gastroesophageal reflux disease)   . Hiatal hernia   . IBS (irritable bowel syndrome)   . Osteopenia   . Restless leg syndrome    Family History  Problem Relation Age of Onset  . Colon cancer Mother 77  . Heart disease Father        MI  . Heart disease Brother   . Lung cancer Brother        x3  . Diabetes Brother   . Hypertension Brother   . Bone cancer Sister   . Lung cancer Sister        remission  . Throat cancer Brother   . Diabetes Maternal Grandmother   . Colon polyps Neg Hx   . Liver disease Neg Hx   . Kidney disease Neg Hx    Past Surgical History:  Procedure Laterality Date  . APPENDECTOMY    . CESAREAN  SECTION    . CHOLECYSTECTOMY    . MOHS SURGERY Left    scalp- SCC   Social History   Occupational History  . Occupation: retired  Tobacco Use  . Smoking status: Never Smoker  . Smokeless tobacco: Never Used  Substance and Sexual Activity  . Alcohol use: Yes    Alcohol/week: 0.0 oz    Comment: occasionally wine  . Drug use: No  . Sexual activity: Not on file

## 2017-12-12 NOTE — Procedures (Signed)
EMG & NCV Findings: All nerve conduction studies (as indicated in the following tables) were within normal limits.    All examined muscles (as indicated in the following table) showed no evidence of electrical instability.    Impression: Essentially NORMAL electrodiagnostic study of the left upper limb.    There is no significant electrodiagnostic evidence of nerve entrapment, brachial plexopathy or cervical radiculopathy.    As you know, purely sensory or demyelinating radiculopathies and chemical radiculitis may not be detected with this particular electrodiagnostic study.  This electrodiagnostic study cannot rule out small fiber polyneuropathy and dysesthesias from central pain sensitization syndromes such as fibromyalgia.  Myotomal referral pain from trigger points is also not excluded.   Recommendations: 1.  Follow-up with referring physician. 2.  Continue current management of symptoms.  ___________________________ Madison [name] FAAPMR Board Certified, American Board of Physical Medicine and Rehabilitation    Nerve Conduction Studies Anti Sensory Summary Table   Stim Site NR Peak (ms) Norm Peak (ms) P-T Amp (V) Norm P-T Amp Site1 Site2 Delta-P (ms) Dist (cm) Vel (m/s) Norm Vel (m/s)  Left Median Acr Palm Anti Sensory (2nd Digit)  33.2C  Wrist    3.2 <3.6 34.5 >10 Wrist Palm 1.6 0.0    Palm    1.6 <2.0 50.2         Left Radial Anti Sensory (Base 1st Digit)  32.9C  Wrist    2.2 <3.1 32.3  Wrist Base 1st Digit 2.2 0.0    Left Ulnar Anti Sensory (5th Digit)  33.5C  Wrist    3.3 <3.7 23.2 >15.0 Wrist 5th Digit 3.3 14.0 42 >38   Motor Summary Table   Stim Site NR Onset (ms) Norm Onset (ms) O-P Amp (mV) Norm O-P Amp Site1 Site2 Delta-0 (ms) Dist (cm) Vel (m/s) Norm Vel (m/s)  Left Median Motor (Abd Poll Brev)  33.2C  Wrist    3.1 <4.2 5.5 >5 Elbow Wrist 3.9 20.0 51 >50  Elbow    7.0  5.8         Left Ulnar Motor (Abd Dig Min)  33.3C  Wrist    3.4 <4.2 8.9 >3 B Elbow  Wrist 2.9 19.0 66 >53  B Elbow    6.3  8.2  A Elbow B Elbow 1.4 9.0 64 >53  A Elbow    7.7  7.9          EMG   Side Muscle Nerve Root Ins Act Fibs Psw Amp Dur Poly Recrt Int Fraser Din Comment  Left Abd Poll Brev Median C8-T1 Nml Nml Nml Nml Nml 0 Nml Nml   Left 1stDorInt Ulnar C8-T1 Nml Nml Nml Nml Nml 0 Nml Nml   Left PronatorTeres Median C6-7 Nml Nml Nml Nml Nml 0 Nml Nml   Left Biceps Musculocut C5-6 Nml Nml Nml Nml Nml 0 Nml Nml   Left Deltoid Axillary C5-6 Nml Nml Nml Nml Nml 0 Nml Nml     Nerve Conduction Studies Anti Sensory Left/Right Comparison   Stim Site L Lat (ms) R Lat (ms) L-R Lat (ms) L Amp (V) R Amp (V) L-R Amp (%) Site1 Site2 L Vel (m/s) R Vel (m/s) L-R Vel (m/s)  Median Acr Palm Anti Sensory (2nd Digit)  33.2C  Wrist 3.2   34.5   Wrist Palm     Palm 1.6   50.2         Radial Anti Sensory (Base 1st Digit)  32.9C  Wrist 2.2   32.3   Wrist  Base 1st Digit     Ulnar Anti Sensory (5th Digit)  33.5C  Wrist 3.3   23.2   Wrist 5th Digit 42     Motor Left/Right Comparison   Stim Site L Lat (ms) R Lat (ms) L-R Lat (ms) L Amp (mV) R Amp (mV) L-R Amp (%) Site1 Site2 L Vel (m/s) R Vel (m/s) L-R Vel (m/s)  Median Motor (Abd Poll Brev)  33.2C  Wrist 3.1   5.5   Elbow Wrist 51    Elbow 7.0   5.8         Ulnar Motor (Abd Dig Min)  33.3C  Wrist 3.4   8.9   B Elbow Wrist 66    B Elbow 6.3   8.2   A Elbow B Elbow 64    A Elbow 7.7   7.9            Waveforms:

## 2017-12-16 DIAGNOSIS — N3001 Acute cystitis with hematuria: Secondary | ICD-10-CM | POA: Diagnosis not present

## 2017-12-16 DIAGNOSIS — R300 Dysuria: Secondary | ICD-10-CM | POA: Diagnosis not present

## 2017-12-22 DIAGNOSIS — K219 Gastro-esophageal reflux disease without esophagitis: Secondary | ICD-10-CM | POA: Diagnosis not present

## 2017-12-22 DIAGNOSIS — K146 Glossodynia: Secondary | ICD-10-CM | POA: Diagnosis not present

## 2017-12-25 ENCOUNTER — Other Ambulatory Visit: Payer: Self-pay | Admitting: Gastroenterology

## 2017-12-26 ENCOUNTER — Ambulatory Visit (INDEPENDENT_AMBULATORY_CARE_PROVIDER_SITE_OTHER): Payer: Self-pay | Admitting: Orthopedic Surgery

## 2017-12-31 ENCOUNTER — Ambulatory Visit (INDEPENDENT_AMBULATORY_CARE_PROVIDER_SITE_OTHER): Payer: Self-pay | Admitting: Orthopedic Surgery

## 2018-01-07 ENCOUNTER — Ambulatory Visit (INDEPENDENT_AMBULATORY_CARE_PROVIDER_SITE_OTHER): Payer: Self-pay | Admitting: Orthopedic Surgery

## 2018-01-07 DIAGNOSIS — R300 Dysuria: Secondary | ICD-10-CM | POA: Diagnosis not present

## 2018-02-20 DIAGNOSIS — L570 Actinic keratosis: Secondary | ICD-10-CM | POA: Diagnosis not present

## 2018-02-20 DIAGNOSIS — L821 Other seborrheic keratosis: Secondary | ICD-10-CM | POA: Diagnosis not present

## 2018-02-20 DIAGNOSIS — Z85828 Personal history of other malignant neoplasm of skin: Secondary | ICD-10-CM | POA: Diagnosis not present

## 2018-02-20 DIAGNOSIS — L219 Seborrheic dermatitis, unspecified: Secondary | ICD-10-CM | POA: Diagnosis not present

## 2018-02-20 DIAGNOSIS — D225 Melanocytic nevi of trunk: Secondary | ICD-10-CM | POA: Diagnosis not present

## 2018-03-13 DIAGNOSIS — R319 Hematuria, unspecified: Secondary | ICD-10-CM | POA: Diagnosis not present

## 2018-03-13 DIAGNOSIS — R350 Frequency of micturition: Secondary | ICD-10-CM | POA: Diagnosis not present

## 2018-03-13 DIAGNOSIS — R300 Dysuria: Secondary | ICD-10-CM | POA: Diagnosis not present

## 2018-03-19 ENCOUNTER — Other Ambulatory Visit: Payer: Self-pay | Admitting: Gastroenterology

## 2018-03-19 ENCOUNTER — Telehealth: Payer: Self-pay | Admitting: Gastroenterology

## 2018-03-19 MED ORDER — FAMOTIDINE 20 MG PO TABS: 40.0000 mg | ORAL_TABLET | Freq: Every day | ORAL | 11 refills | Status: DC

## 2018-03-19 NOTE — Telephone Encounter (Signed)
Patient notified.Sent in Famotidine 40mg  daily

## 2018-03-28 DIAGNOSIS — N390 Urinary tract infection, site not specified: Secondary | ICD-10-CM | POA: Diagnosis not present

## 2018-04-02 DIAGNOSIS — N952 Postmenopausal atrophic vaginitis: Secondary | ICD-10-CM | POA: Diagnosis not present

## 2018-04-06 DIAGNOSIS — R399 Unspecified symptoms and signs involving the genitourinary system: Secondary | ICD-10-CM | POA: Diagnosis not present

## 2018-04-06 DIAGNOSIS — R5383 Other fatigue: Secondary | ICD-10-CM | POA: Diagnosis not present

## 2018-04-06 DIAGNOSIS — R110 Nausea: Secondary | ICD-10-CM | POA: Diagnosis not present

## 2018-04-06 DIAGNOSIS — R42 Dizziness and giddiness: Secondary | ICD-10-CM | POA: Diagnosis not present

## 2018-04-06 DIAGNOSIS — Z23 Encounter for immunization: Secondary | ICD-10-CM | POA: Diagnosis not present

## 2018-04-06 DIAGNOSIS — F419 Anxiety disorder, unspecified: Secondary | ICD-10-CM | POA: Diagnosis not present

## 2018-04-20 DIAGNOSIS — N3000 Acute cystitis without hematuria: Secondary | ICD-10-CM | POA: Diagnosis not present

## 2018-04-30 DIAGNOSIS — I10 Essential (primary) hypertension: Secondary | ICD-10-CM | POA: Diagnosis not present

## 2018-04-30 DIAGNOSIS — F419 Anxiety disorder, unspecified: Secondary | ICD-10-CM | POA: Diagnosis not present

## 2018-05-18 DIAGNOSIS — J22 Unspecified acute lower respiratory infection: Secondary | ICD-10-CM | POA: Diagnosis not present

## 2018-05-25 IMAGING — DX DG CHEST 2V
2 series · 2 of 2 positions shown · non-contrast
Comparison: Chest x-ray of May 30, 2016

CLINICAL DATA: Mid chest burning sensation and shortness of breath
for the past 3 days. Shortness of breath is worse with exertion.
Patient also reports dizziness and headache for the past 3 weeks.
Never smoked.

EXAM:
CHEST  2 VIEW

[chest pa]
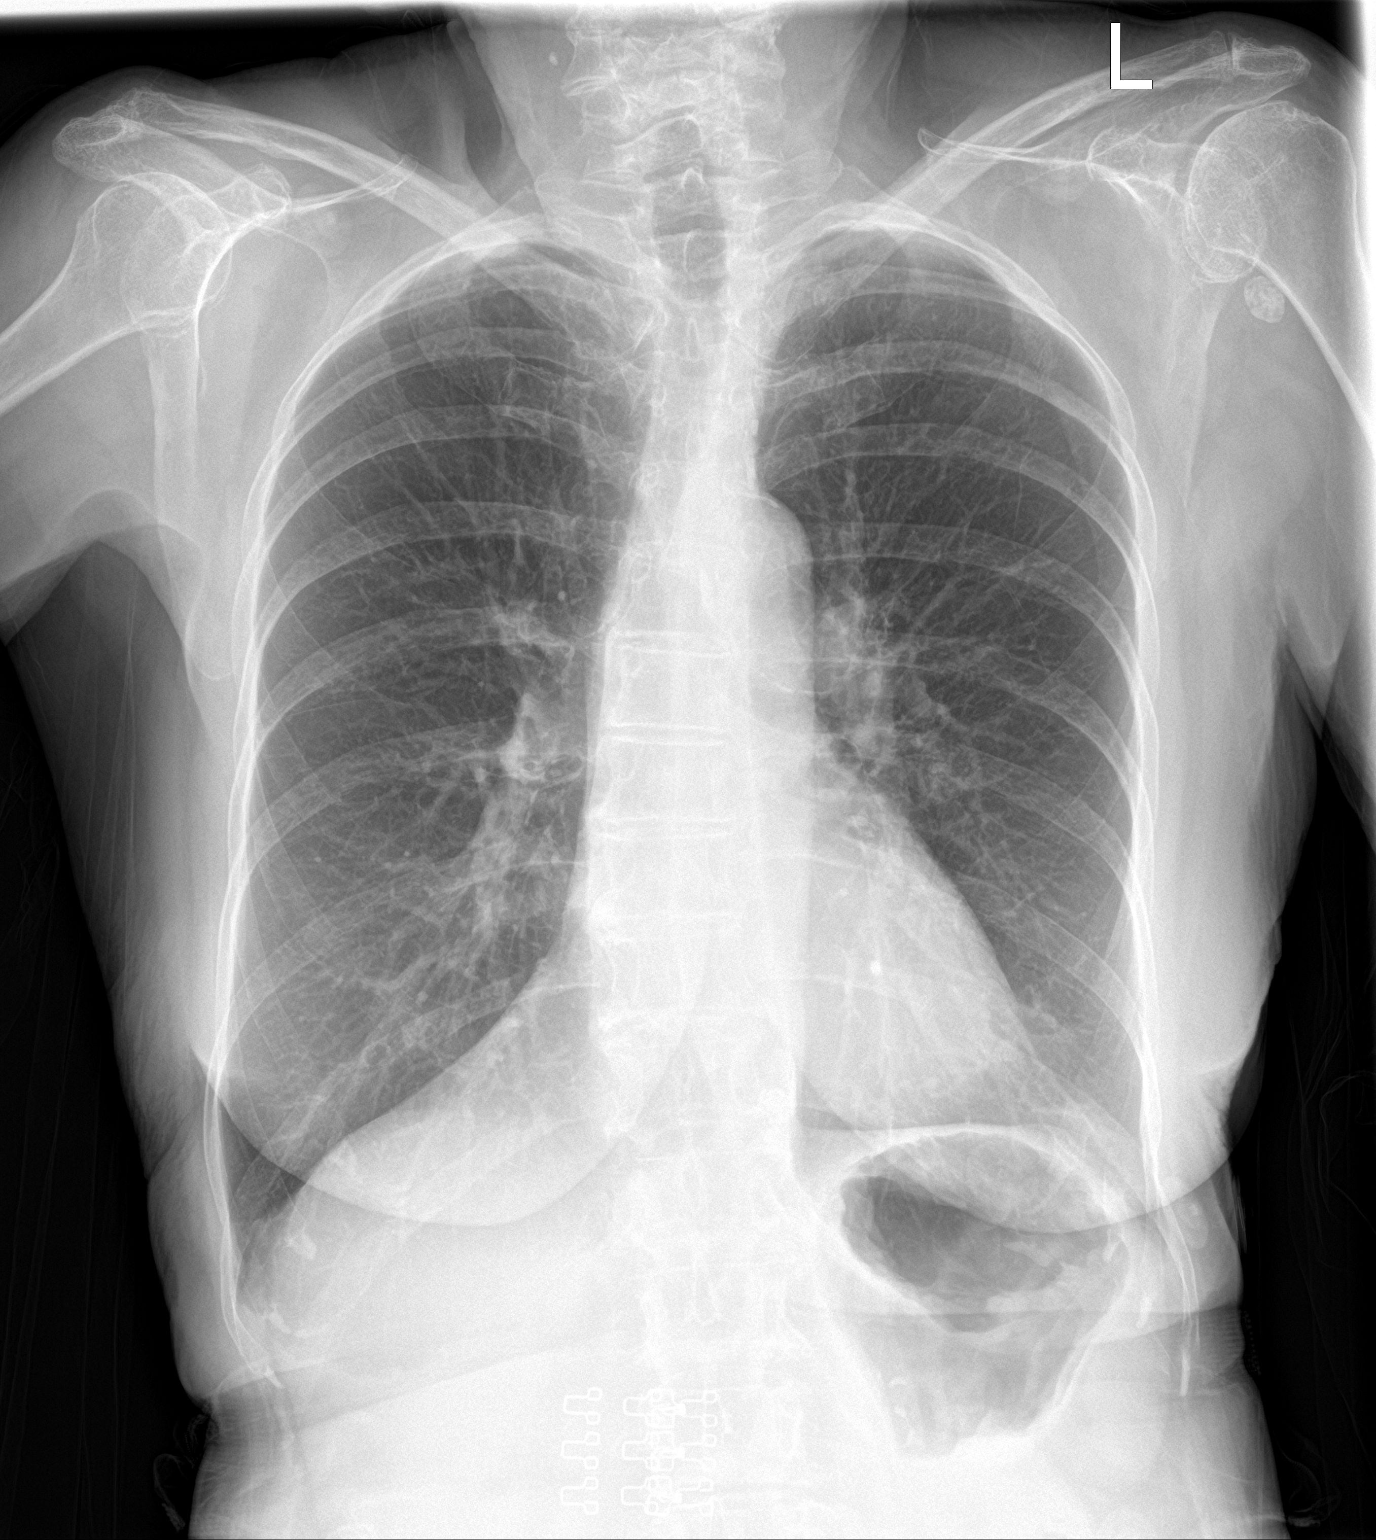

[chest lat]
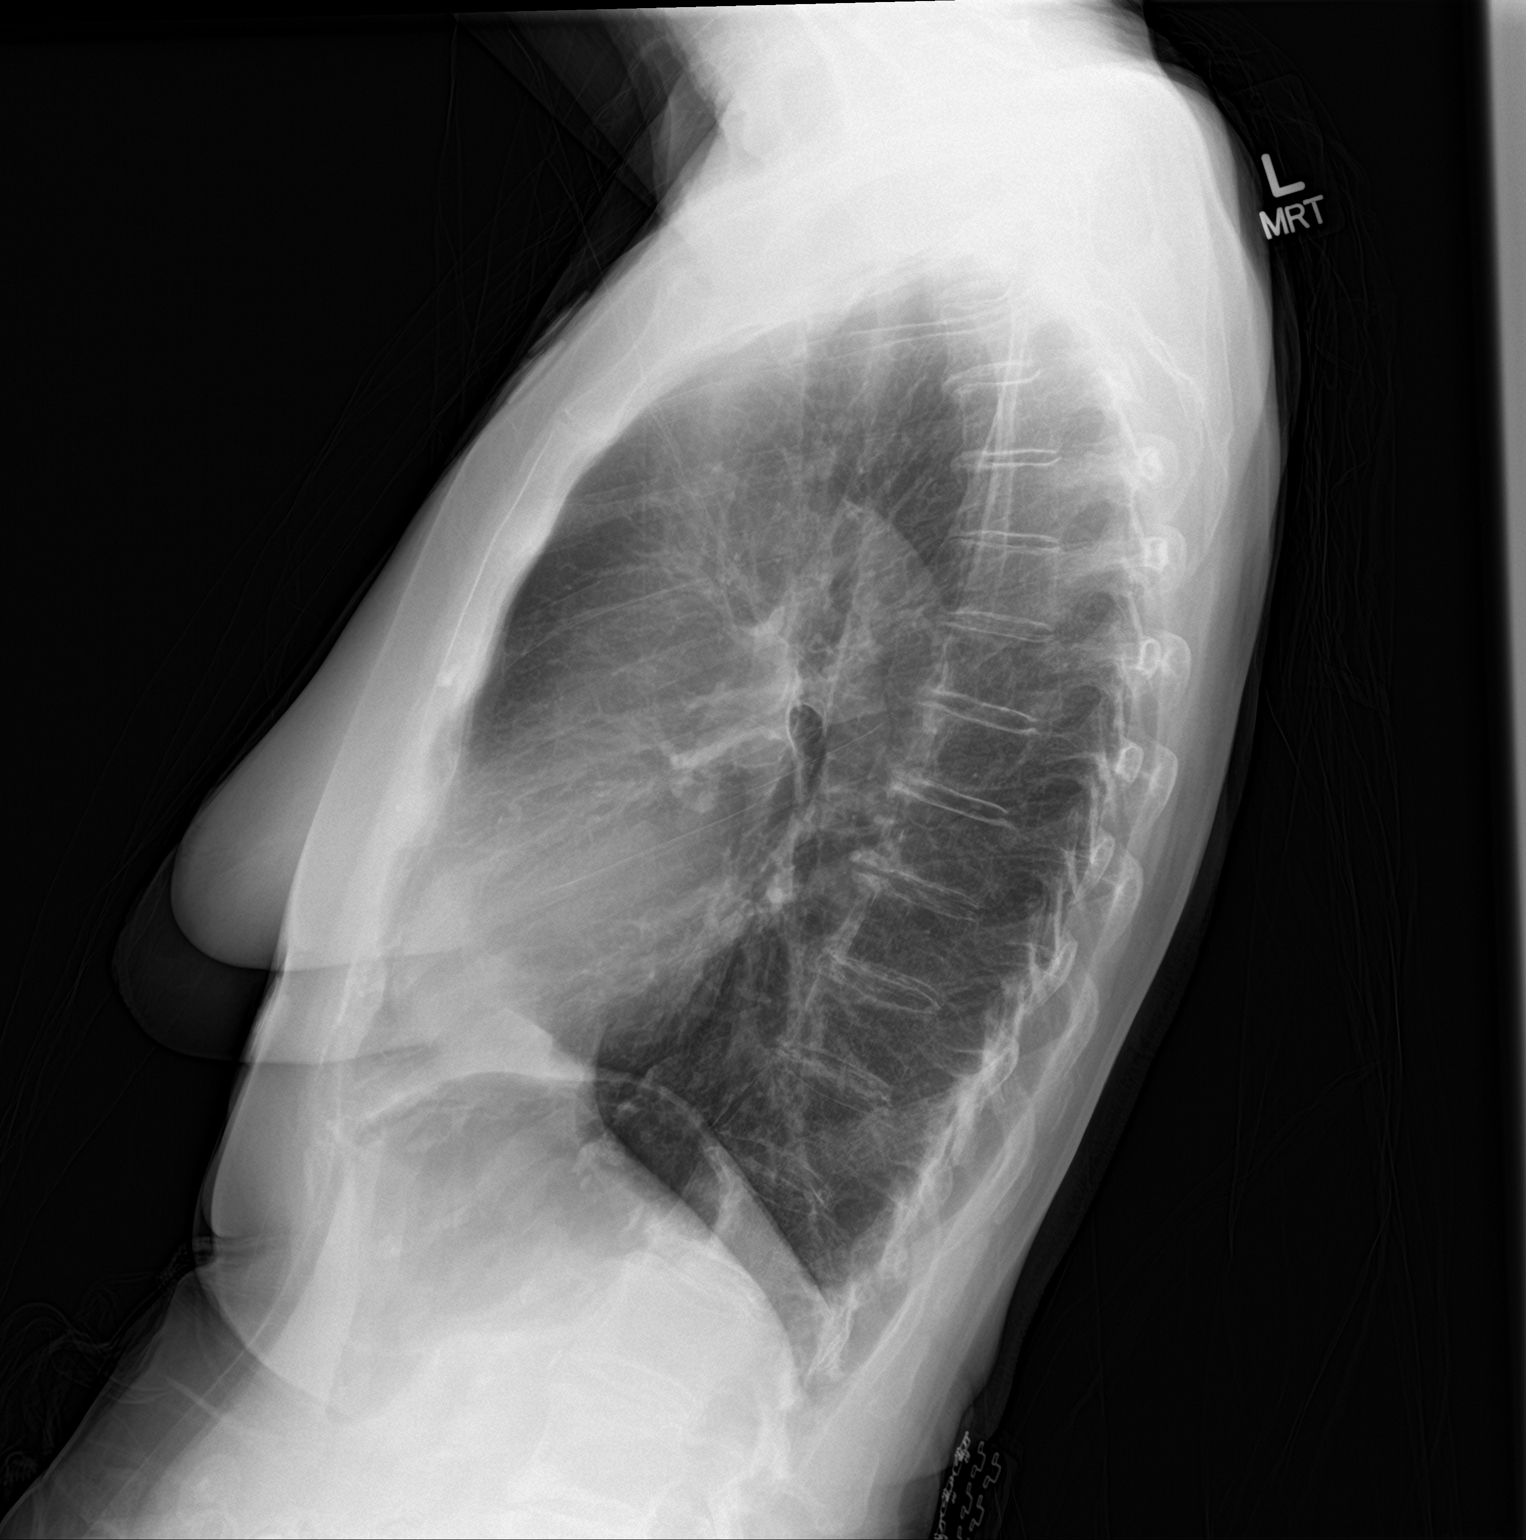

[2 of 2 positions shown; findings below may reference images not displayed]

FINDINGS: The lungs are mildly hyperinflated and clear. The heart and
pulmonary vascularity are normal. The mediastinum is normal in
width. There is no pleural effusion. There is calcification in the
wall of the thoracic aorta. The bony thorax exhibits no acute
abnormality.
IMPRESSION: There is no acute cardiopulmonary abnormality. Mild hyperinflation
may be voluntary or may reflect chronic bronchitic changes.

## 2018-07-18 ENCOUNTER — Emergency Department (HOSPITAL_COMMUNITY): Payer: Medicare HMO

## 2018-07-18 ENCOUNTER — Emergency Department (HOSPITAL_COMMUNITY)
Admission: EM | Admit: 2018-07-18 | Discharge: 2018-07-18 | Disposition: A | Discharge status: Home / Self Care | Payer: Medicare HMO | Attending: Emergency Medicine | Admitting: Emergency Medicine

## 2018-07-18 DIAGNOSIS — R42 Dizziness and giddiness: Secondary | ICD-10-CM | POA: Diagnosis present

## 2018-07-18 DIAGNOSIS — Z79899 Other long term (current) drug therapy: Secondary | ICD-10-CM | POA: Diagnosis not present

## 2018-07-18 DIAGNOSIS — J019 Acute sinusitis, unspecified: Secondary | ICD-10-CM | POA: Diagnosis not present

## 2018-07-18 LAB — INFLUENZA PANEL BY PCR (TYPE A & B)
INFLAPCR: NEGATIVE
Influenza B By PCR: NEGATIVE

## 2018-07-18 MED ORDER — AMOXICILLIN-POT CLAVULANATE 875-125 MG PO TABS
1.0000 | ORAL_TABLET | Freq: Once | ORAL | Status: AC
  Administered 2018-07-18: 1 via ORAL
  Filled 2018-07-18: qty 1

## 2018-07-18 MED ORDER — AMOXICILLIN-POT CLAVULANATE 875-125 MG PO TABS: 1.0000 | ORAL_TABLET | Freq: Two times a day (BID) | ORAL | 0 refills | Status: DC

## 2018-07-18 MED ORDER — BENZONATATE 100 MG PO CAPS
100.0000 mg | ORAL_CAPSULE | Freq: Once | ORAL | Status: AC
  Administered 2018-07-18: 100 mg via ORAL
  Filled 2018-07-18: qty 1

## 2018-07-18 MED ORDER — BENZONATATE 100 MG PO CAPS: 100.0000 mg | ORAL_CAPSULE | Freq: Three times a day (TID) | ORAL | 0 refills | Status: DC | PRN

## 2018-07-18 MED ORDER — ALBUTEROL SULFATE HFA 108 (90 BASE) MCG/ACT IN AERS
2.0000 | INHALATION_SPRAY | Freq: Once | RESPIRATORY_TRACT | Status: AC
  Administered 2018-07-18: 2 via RESPIRATORY_TRACT
  Filled 2018-07-18: qty 6.7

## 2018-07-18 NOTE — ED Provider Notes (Signed)
[facility] EMERGENCY DEPARTMENT Provider Note   CSN: [id number] Arrival date & time: 07/18/18  9485     History   Chief Complaint Chief Complaint  Patient presents with  . Dizziness    HPI Madison [name] is a 85 y.o. female.   Dizziness  Quality:  Lightheadedness Severity:  Mild Onset quality:  Sudden Progression:  Resolved Context: bending over   Relieved by:  Nothing Worsened by:  Nothing Associated symptoms: no chest pain and no shortness of breath   Cough  Associated symptoms: no chest pain and no shortness of breath     Past Medical History:  Diagnosis Date  . Arthritis   . Bladder infection   . Cancer, skin, squamous cell   . Colon polyps   . Frozen shoulder    left   . Gallstones   . GERD (gastroesophageal reflux disease)   . Hiatal hernia   . IBS (irritable bowel syndrome)   . Osteopenia   . Restless leg syndrome     Patient Active Problem List   Diagnosis Date Noted  . Raspy voice 12/05/2017  . Urinary tract infectious disease 04/21/2017  . Anxiety 10/17/2015  . Chronic rhinitis 10/17/2015  . Fatigue 10/17/2015  . CLAUDICATION 08/31/2010  . CHEST PAIN 08/31/2010  . Claudication ([place]) 08/31/2010  . GERD 10/14/2007    Past Surgical History:  Procedure Laterality Date  . APPENDECTOMY    . CESAREAN SECTION    . CHOLECYSTECTOMY    . MOHS SURGERY Left    scalp- SCC     OB History   No obstetric history on file.      Home Medications    Prior to Admission medications   Medication Sig Start Date End Date Taking? Authorizing Provider  ALPRAZolam (XANAX) 0.25 MG tablet TAKE 1 TABLET BY MOUTH TWICE A DAY 10/13/17   [provider]  amLODipine (NORVASC) 2.5 MG tablet TAKE 1 TABLET BY MOUTH EVERY DAY 10/13/17   [provider]  amoxicillin-clavulanate (AUGMENTIN) 875-125 MG tablet Take 1 tablet by mouth 2 (two) times daily. One po bid x 7 days 07/18/18   Decarlos Empey, Corene Cornea, MD  benzonatate (TESSALON) 100 MG  capsule Take 1 capsule (100 mg total) by mouth 3 (three) times daily as needed for cough. 07/18/18   Joelie Schou, Corene Cornea, MD  Calcium-Vitamin D-Vitamin K (VIACTIV) 462-703-50 MG-UNT-MCG CHEW Chew 2 each by mouth daily.      [provider]  famotidine (PEPCID) 20 MG tablet Take 2 tablets (40 mg total) by mouth daily. 03/19/18   Milus Banister, MD  fluticasone (FLONASE) 50 MCG/ACT nasal spray fluticasone propionate 50 mcg/actuation nasal spray,suspension    [provider]  nitrofurantoin, macrocrystal-monohydrate, (MACROBID) 100 MG capsule Take 100 mg by mouth 2 (two) times daily. 10/18/17   [provider]  pantoprazole (PROTONIX) 40 MG tablet 40 mg 2 times daily. 12/22/16   [provider]  pantoprazole (PROTONIX) 40 MG tablet TAKE 1 TABLET BY MOUTH TWICE A DAY 12/25/17   Milus Banister, MD  ranitidine (ZANTAC) 150 MG capsule Take 150 mg by mouth daily.    [provider]    Family History Family History  Problem Relation Age of Onset  . Colon cancer Mother 27  . Heart disease Father        MI  . Heart disease Brother   . Lung cancer Brother        x3  . Diabetes Brother   .  Hypertension Brother   . Bone cancer Sister   . Lung cancer Sister        remission  . Throat cancer Brother   . Diabetes Maternal Grandmother   . Colon polyps Neg Hx   . Liver disease Neg Hx   . Kidney disease Neg Hx     Social History Social History   Tobacco Use  . Smoking status: Never Smoker  . Smokeless tobacco: Never Used  Substance Use Topics  . Alcohol use: Yes    Alcohol/week: 0.0 standard drinks    Comment: occasionally wine  . Drug use: No     Allergies   Nitroglycerin and Sucralfate   Review of Systems Review of Systems  Respiratory: Positive for cough. Negative for choking and shortness of breath.   Cardiovascular: Negative for chest pain.  Neurological: Positive for dizziness.  All other systems reviewed and are negative.    Physical  Exam Updated Vital Signs BP (!) 138/59   Pulse 66   Temp 98.1 F (36.7 C) (Oral)   Resp 20   SpO2 94%   Physical Exam Vitals signs and nursing note reviewed.  Constitutional:      Appearance: She is well-developed.  HENT:     Head: Normocephalic and atraumatic.  Eyes:     Extraocular Movements: Extraocular movements intact.     Conjunctiva/sclera: Conjunctivae normal.  Neck:     Musculoskeletal: Normal range of motion.  Cardiovascular:     Rate and Rhythm: Normal rate and regular rhythm.  Pulmonary:     Effort: Pulmonary effort is normal. No respiratory distress.     Breath sounds: Normal breath sounds. No stridor.  Abdominal:     General: There is no distension.  Neurological:     Mental Status: She is alert.      ED Treatments / Results  Labs (all labs ordered are listed, but only abnormal results are displayed) Labs Reviewed  INFLUENZA PANEL BY PCR (TYPE A & B)    EKG EKG Interpretation  Date/Time:  Saturday July 18 2018 20:39:00 EST Ventricular Rate:  72 PR Interval:    QRS Duration: 89 QT Interval:  380 QTC Calculation: 416 R Axis:   24 Text Interpretation:  Sinus rhythm Nonspecific repol abnormality, lateral leads No significant change since last tracing Confirmed by Merrily Pew [id number]) on 07/18/2018 9:09:55 PM   Radiology Dg Chest 2 View  Result Date: 07/18/2018 CLINICAL DATA:  Cough for a few days. EXAM: CHEST - 2 VIEW COMPARISON:  04/24/2017 FINDINGS: Lungs are hyperexpanded. The lungs are clear without focal pneumonia, edema, pneumothorax or pleural effusion. The cardiopericardial silhouette is within normal limits for size. Opacity at the cardiac apex compatible with fat pad as seen on abdomen CT of 02/16/2015. Bones are diffusely demineralized. Degenerative changes noted left shoulder. Telemetry leads overlie the chest. IMPRESSION: Hyperexpansion without acute cardiopulmonary findings. Electronically Signed   By: Misty Stanley M.D.   On:  07/18/2018 21:45    Procedures Procedures (including critical care time)  Medications Ordered in ED Medications  amoxicillin-clavulanate (AUGMENTIN) 875-125 MG per tablet 1 tablet (1 tablet Oral Given 07/18/18 2244)  benzonatate (TESSALON) capsule 100 mg (100 mg Oral Given 07/18/18 2243)  albuterol (PROVENTIL HFA;VENTOLIN HFA) 108 (90 Base) MCG/ACT inhaler 2 puff (2 puffs Inhalation Given 07/18/18 2243)     Initial Impression / Assessment and Plan / ED Course  I have reviewed the triage vital signs and the nursing notes.  Pertinent labs & imaging results that  were available during my care of the patient were reviewed by me and considered in my medical decision making (see chart for details).     Likely sinusitis no complications. Treated for same.   Final Clinical Impressions(s) / ED Diagnoses   Final diagnoses:  Dizziness  Acute non-recurrent sinusitis, unspecified location    ED Discharge Orders         Ordered    amoxicillin-clavulanate (AUGMENTIN) 875-125 MG tablet  2 times daily     07/18/18 2259    benzonatate (TESSALON) 100 MG capsule  3 times daily PRN     07/18/18 2259           Shalena Ezzell, Corene Cornea, MD 07/21/18 0006

## 2018-07-18 NOTE — ED Notes (Signed)
Patient verbalizes understanding of discharge instructions. Opportunity for questioning and answers were provided. Armband removed by staff, pt discharged from ED.  

## 2018-07-18 NOTE — ED Notes (Signed)
Delay in lab draw,  Pt not in room 

## 2018-09-28 ENCOUNTER — Telehealth: Payer: Self-pay | Admitting: Gastroenterology

## 2018-09-28 NOTE — Telephone Encounter (Signed)
Pt states she has started having burning in her stomach, feels hungry all the time and has a spot on the right side of her abdomen that is sore when she gets up in the morning. Reports all this started about 2 weeks ago. She states she is taking protonix 40mg  in the am along with pepcid 40mg  at night. Pt reports this was working well until 2 weeks ago. She is requesting something to help with the burning in her stomach, something to "settle her stomach." Please advise.

## 2018-09-30 NOTE — Telephone Encounter (Signed)
Please offer her a doubling of her Protonix, 40 mg p.o. twice daily.  Dispense 60 pills with 3 refills.  Please also recommend a virtual visit with me in the next few weeks to see how she is doing.  Thank you

## 2019-01-14 ENCOUNTER — Telehealth: Payer: Self-pay | Admitting: Gastroenterology

## 2019-01-14 NOTE — Telephone Encounter (Signed)
The pt states when her husband died she was put on xanax and it helped but she decided to stop it and since then her reflux has worsened.  She is on protonix BID and pepcid qhs. She is due for a follow up and we scheduled that for 8/18.  We also discussed anti reflux precautions.  She will keep appt and call back with any further concerns.

## 2019-01-26 ENCOUNTER — Other Ambulatory Visit: Payer: Self-pay | Admitting: Gastroenterology

## 2019-01-26 ENCOUNTER — Telehealth: Payer: Self-pay | Admitting: Gastroenterology

## 2019-01-26 MED ORDER — FAMOTIDINE 20 MG PO TABS: 40.0000 mg | ORAL_TABLET | Freq: Every day | ORAL | 11 refills | Status: DC

## 2019-01-26 NOTE — Telephone Encounter (Signed)
Spoke to patient. Prescription sent to [facility]

## 2019-02-02 ENCOUNTER — Other Ambulatory Visit (INDEPENDENT_AMBULATORY_CARE_PROVIDER_SITE_OTHER): Payer: Medicare HMO

## 2019-02-02 ENCOUNTER — Encounter: Payer: Self-pay | Admitting: Gastroenterology

## 2019-02-02 ENCOUNTER — Ambulatory Visit: Payer: Medicare HMO | Admitting: Gastroenterology

## 2019-02-02 ENCOUNTER — Ambulatory Visit (INDEPENDENT_AMBULATORY_CARE_PROVIDER_SITE_OTHER)
Admission: RE | Admit: 2019-02-02 | Discharge: 2019-02-02 | Disposition: A | Discharge status: Home / Self Care | Payer: Medicare HMO | Source: Ambulatory Visit | Attending: Gastroenterology | Admitting: Gastroenterology

## 2019-02-02 ENCOUNTER — Other Ambulatory Visit: Payer: Self-pay

## 2019-02-02 VITALS — BP 130/68 | HR 92 | Temp 97.0°F | Ht 64.0 in | Wt 116.0 lb

## 2019-02-02 DIAGNOSIS — R0602 Shortness of breath: Secondary | ICD-10-CM

## 2019-02-02 DIAGNOSIS — K219 Gastro-esophageal reflux disease without esophagitis: Secondary | ICD-10-CM | POA: Diagnosis not present

## 2019-02-02 LAB — CBC WITH DIFFERENTIAL/PLATELET
Basophils Absolute: 0 10*3/uL (ref 0.0–0.1)
Basophils Relative: 0.8 % (ref 0.0–3.0)
Eosinophils Absolute: 0 10*3/uL (ref 0.0–0.7)
Eosinophils Relative: 0.2 % (ref 0.0–5.0)
HCT: 37.3 % (ref 36.0–46.0)
Hemoglobin: 12.6 g/dL (ref 12.0–15.0)
Lymphocytes Relative: 28.9 % (ref 12.0–46.0)
Lymphs Abs: 1.5 10*3/uL (ref 0.7–4.0)
MCHC: 33.8 g/dL (ref 30.0–36.0)
MCV: 93 fl (ref 78.0–100.0)
Monocytes Absolute: 0.5 10*3/uL (ref 0.1–1.0)
Monocytes Relative: 9.1 % (ref 3.0–12.0)
Neutro Abs: 3.2 10*3/uL (ref 1.4–7.7)
Neutrophils Relative %: 61 % (ref 43.0–77.0)
Platelets: 284 10*3/uL (ref 150.0–400.0)
RBC: 4.02 Mil/uL (ref 3.87–5.11)
RDW: 13.1 % (ref 11.5–15.5)
WBC: 5.3 10*3/uL (ref 4.0–10.5)

## 2019-02-02 LAB — URINALYSIS, ROUTINE W REFLEX MICROSCOPIC
Bilirubin Urine: NEGATIVE
Ketones, ur: NEGATIVE
Leukocytes,Ua: NEGATIVE
Nitrite: NEGATIVE
Specific Gravity, Urine: 1.01 (ref 1.000–1.030)
Total Protein, Urine: NEGATIVE
Urine Glucose: NEGATIVE
Urobilinogen, UA: 0.2 (ref 0.0–1.0)
WBC, UA: NONE SEEN (ref 0–?)
pH: 7.5 (ref 5.0–8.0)

## 2019-02-02 NOTE — Patient Instructions (Signed)
Your provider has requested that you go to the basement level for lab work before leaving today. Press "B" on the elevator. The lab is located at the first door on the left as you exit the elevator.  Chest X-Ray today   Please take your protonix twice daily before breakfast and dinner  Thank you for entrusting me with your care and choosing [facility].  Dr Ardis Hughs

## 2019-02-02 NOTE — Progress Notes (Signed)
Review of pertinent gastrointestinal problems:  1. Family history for colon cancer (diagnosed in her late 54s), personal history of tubular adenomas. Last colonoscopy November 2007 by Dr. Lyla Son, tortuous although normal. Next colonoscopy November 2012. Was scheduled for colonoscopy 2012 but she had difficulty with the prep (moviprep) and she canceled it. 10/2011 Colonoscopy Dr. Ardis Hughs with different prep; normal except small hemorrhoids. Recommended no further CRC screening given age. 2. Chronic gastroesophageal reflux disease. Has been on a variety of anti-acid, on a variety of PPIs with mixed results. esophagogastroduodenoscopy January 2005 by Dr. Lyla Son. No esophagitis but a Schatzki's ring was seen. Hyperplastic polyps in the gastric body. EGD February 2008 by Dr. Ardis Hughs was normal except for previously sampled hyperplastic-appearing gastric polyps.  3. Chronic dyspepsia, somewhat GERD like. Repeat EGD Dr. Ardis Hughs 12/2014 found mild gastritis, h. Pylori negative. Follow up CT scan 12/2014 was unrevealing. Improved on omeprazole.2017/2018 intermittently improves on variety of PPI trials.    HPI: This is a very pleasant 85 year old woman whom I last saw a little over a year ago.  She is tearful today in the office.  She is still obviously saddened by her husband's passing this past February.  She tells me she is weak and dizzy and nauseous and has a raw sensation in her chest.  She was told that she had a sinus infection 2 weeks ago at an urgent clinic and was put on amoxicillin.  She is almost finished with the amoxicillin.  She said she has had some breathing trouble for many months, shortness of breath especially with exertion.  She was supposed to get a chest x-ray.her husband passed and she never got back around to doing it.  Chief complaint is dizziness, weakness, raw sensation in her chest  Her weight is down about 3 pounds since her last visit here 11/2017  ROS: complete GI ROS  as described in HPI, all other review negative.  Constitutional:  No unintentional weight loss   Past Medical History:  Diagnosis Date  . Arthritis   . Bladder infection   . Cancer, skin, squamous cell   . Colon polyps   . Frozen shoulder    left   . Gallstones   . GERD (gastroesophageal reflux disease)   . Hiatal hernia   . IBS (irritable bowel syndrome)   . Osteopenia   . Restless leg syndrome     Past Surgical History:  Procedure Laterality Date  . APPENDECTOMY    . CESAREAN SECTION    . CHOLECYSTECTOMY    . MOHS SURGERY Left    scalp- SCC    Current Outpatient Medications  Medication Sig Dispense Refill  . amLODipine (NORVASC) 2.5 MG tablet TAKE 1 TABLET BY MOUTH EVERY DAY    . Calcium-Vitamin D-Vitamin K (VIACTIV) [id number] MG-UNT-MCG CHEW Chew 2 each by mouth daily.      . famotidine (PEPCID) 20 MG tablet Take 2 tablets (40 mg total) by mouth daily. 30 tablet 11  . pantoprazole (PROTONIX) 40 MG tablet TAKE 1 TABLET BY MOUTH TWICE A DAY 180 tablet 3   No current facility-administered medications for this visit.     Allergies as of 02/02/2019 - Review Complete 02/02/2019  Allergen Reaction Noted  . Nitroglycerin Other (See Comments) 11/16/2015  . Sucralfate Other (See Comments) 05/22/2017    Family History  Problem Relation Age of Onset  . Colon cancer Mother 37  . Heart disease Father        MI  . Heart  disease Brother   . Lung cancer Brother        x3  . Diabetes Brother   . Hypertension Brother   . Bone cancer Sister   . Lung cancer Sister        remission  . Throat cancer Brother   . Diabetes Maternal Grandmother   . Colon polyps Neg Hx   . Liver disease Neg Hx   . Kidney disease Neg Hx     Social History   Socioeconomic History  . Marital status: Married    Spouse name: Not on file  . Number of children: 2  . Years of education: Not on file  . Highest education level: Not on file  Occupational History  . Occupation: retired  Photographer  . Financial resource strain: Not on file  . Food insecurity    Worry: Not on file    Inability: Not on file  . Transportation needs    Medical: Not on file    Non-medical: Not on file  Tobacco Use  . Smoking status: Never Smoker  . Smokeless tobacco: Never Used  Substance and Sexual Activity  . Alcohol use: Yes    Alcohol/week: 0.0 standard drinks    Comment: occasionally wine  . Drug use: No  . Sexual activity: Not on file  Lifestyle  . Physical activity    Days per week: Not on file    Minutes per session: Not on file  . Stress: Not on file  Relationships  . Social Herbalist on phone: Not on file    Gets together: Not on file    Attends religious service: Not on file    Active member of club or organization: Not on file    Attends meetings of clubs or organizations: Not on file    Relationship status: Not on file  . Intimate partner violence    Fear of current or ex partner: Not on file    Emotionally abused: Not on file    Physically abused: Not on file    Forced sexual activity: Not on file  Other Topics Concern  . Not on file  Social History Narrative  . Not on file     Physical Exam: BP 130/68   Pulse 92   Temp (!) 97 F (36.1 C)   Ht 5\' 4"  (1.626 m)   Wt 116 lb (52.6 kg)   BMI 19.91 kg/m  Constitutional: Very tearful, sounds congested nasally Psychiatric: alert and oriented x3 Lungs clear to auscultation bilaterally Heart regular rate and rhythm Abdomen: soft, nontender, nondistended, no obvious ascites, no peritoneal signs, normal bowel sounds No peripheral edema noted in lower extremities  Assessment and plan: 85 y.o. female with dizziness, weakness, nausea, shortness of breath  Unclear etiology.  I am not sure that this is specifically a GI issue.  I think a lot of the symptoms has been going on for many months.  She was quite tearful in the room today and depression may be playing a role since the passing of her husband this  past February.  We are going to get set of blood work including CBC and complete metabolic profile as well as 2 view chest x-ray today.  I instructed her to start taking her proton pump inhibitor twice daily shortly before meals and her Pepcid at bedtime.  Please see the "Patient Instructions" section for addition details about the plan.  Owens Loffler, MD [facility] 02/02/2019, 2:03 PM

## 2019-02-03 ENCOUNTER — Other Ambulatory Visit: Payer: Self-pay | Admitting: Gastroenterology

## 2019-02-03 ENCOUNTER — Other Ambulatory Visit (INDEPENDENT_AMBULATORY_CARE_PROVIDER_SITE_OTHER): Payer: Medicare HMO

## 2019-02-03 DIAGNOSIS — K219 Gastro-esophageal reflux disease without esophagitis: Secondary | ICD-10-CM | POA: Diagnosis not present

## 2019-02-03 DIAGNOSIS — R0602 Shortness of breath: Secondary | ICD-10-CM | POA: Diagnosis not present

## 2019-02-03 LAB — COMPREHENSIVE METABOLIC PANEL
ALT: 19 U/L (ref 0–35)
AST: 14 U/L (ref 0–37)
Albumin: 4.4 g/dL (ref 3.5–5.2)
Alkaline Phosphatase: 53 U/L (ref 39–117)
BUN: 9 mg/dL (ref 6–23)
CO2: 30 mEq/L (ref 19–32)
Calcium: 9.2 mg/dL (ref 8.4–10.5)
Chloride: 97 mEq/L (ref 96–112)
Creatinine, Ser: 0.62 mg/dL (ref 0.40–1.20)
GFR: 91.89 mL/min (ref >60.00)
Glucose, Bld: 93 mg/dL (ref 70–99)
Potassium: 3.9 mEq/L (ref 3.5–5.1)
Sodium: 134 mEq/L — ABNORMAL LOW (ref 135–145)
Total Bilirubin: 0.6 mg/dL (ref 0.2–1.2)
Total Protein: 7.1 g/dL (ref 6.0–8.3)

## 2019-04-19 ENCOUNTER — Telehealth: Payer: Self-pay | Admitting: Gastroenterology

## 2019-04-20 MED ORDER — SUCRALFATE 1 GM/10ML PO SUSP: 1.0000 g | Freq: Three times a day (TID) | ORAL | 3 refills | Status: DC

## 2019-04-20 NOTE — Telephone Encounter (Signed)
The pt called to complain of worse GERD since having a sinus infection and being put on antibiotics about 2 months ago.  She is taking protonix 40 mg twice daily 20-30 min prior to meals and Pepcid 20 mg at bedtime.  She says that she was doing very well until that infection.  She has burning and foods feel like it does not go down without water or liquid.  She questions whether the protonix is less effective because she has been on it so long. Please advise.

## 2019-04-20 NOTE — Telephone Encounter (Signed)
The pt has been advised and prescription sent to the pharmacy.  The pt will call back to set up appt

## 2019-04-20 NOTE — Telephone Encounter (Signed)
Lets add carafate 1gm TID with meals and offer OV with me, next available.  Thanks

## 2019-04-22 ENCOUNTER — Other Ambulatory Visit: Payer: Self-pay

## 2019-04-22 MED ORDER — FAMOTIDINE 20 MG PO TABS: 40.0000 mg | ORAL_TABLET | Freq: Every day | ORAL | 11 refills | Status: DC

## 2019-04-23 ENCOUNTER — Telehealth: Payer: Self-pay | Admitting: Gastroenterology

## 2019-04-23 NOTE — Telephone Encounter (Signed)
Pt states that copay for carafate is over $200, she wants something more affordable.

## 2019-04-26 ENCOUNTER — Other Ambulatory Visit: Payer: Self-pay

## 2019-04-26 MED ORDER — SUCRALFATE 1 G PO TABS: 1.0000 g | ORAL_TABLET | Freq: Three times a day (TID) | ORAL | 1 refills | Status: DC

## 2019-04-26 NOTE — Telephone Encounter (Signed)
New script sent to pharmacy

## 2019-04-26 NOTE — Telephone Encounter (Signed)
Chong Sicilian, is this something you are working on?

## 2019-04-26 NOTE — Telephone Encounter (Signed)
Pt called to inform that copay for pill form is only $5 so pt is requesting carafate in pill form.

## 2019-05-10 ENCOUNTER — Other Ambulatory Visit: Payer: Self-pay

## 2019-05-10 MED ORDER — PANTOPRAZOLE SODIUM 40 MG PO TBEC: 40.0000 mg | DELAYED_RELEASE_TABLET | Freq: Two times a day (BID) | ORAL | 0 refills | Status: DC

## 2019-05-10 NOTE — Telephone Encounter (Signed)
Patient's pantoprazole refilled, up to date on office visits.

## 2019-05-17 ENCOUNTER — Telehealth: Payer: Self-pay | Admitting: Gastroenterology

## 2019-05-17 MED ORDER — LANSOPRAZOLE 30 MG PO CPDR: 30.0000 mg | DELAYED_RELEASE_CAPSULE | Freq: Every day | ORAL | 0 refills | Status: DC

## 2019-05-17 NOTE — Telephone Encounter (Signed)
The pt is calling with continued reflux despite pepcid, protonix and carafate.  The pt would like to change from protonix to prevacid.  She says she took prevacid at one time and it worked well.  I have sent in prevacid and she will call back if this does not seem to make a difference.  FYI

## 2019-05-24 ENCOUNTER — Telehealth: Payer: Self-pay | Admitting: Gastroenterology

## 2019-05-24 NOTE — Telephone Encounter (Signed)
appt made to see Dr Ardis Hughs on 07/13/19 to discuss abd pain and failure of carafte. She was offered an appt with an app but she declined.

## 2019-06-04 ENCOUNTER — Telehealth: Payer: Self-pay | Admitting: Gastroenterology

## 2019-06-04 MED ORDER — OMEPRAZOLE 40 MG PO CPDR: 40.0000 mg | DELAYED_RELEASE_CAPSULE | Freq: Two times a day (BID) | ORAL | 3 refills | Status: DC

## 2019-06-04 NOTE — Telephone Encounter (Signed)
The pt asked if we could change her prevacid to omeprazole 40 mg.  Her reflux is no better on prevacid. She has a follow up appt in January.  She will try the omeprazole and call back if she gets worse before the appt.

## 2019-06-08 ENCOUNTER — Other Ambulatory Visit: Payer: Self-pay | Admitting: Gastroenterology

## 2019-06-08 NOTE — Telephone Encounter (Signed)
Please refer to the note in patients chart on 06/04/2019. Unsure what to do with this medication request since it states prevacid does not work for her?

## 2019-07-13 ENCOUNTER — Encounter: Payer: Self-pay | Admitting: Gastroenterology

## 2019-07-13 ENCOUNTER — Ambulatory Visit: Payer: Medicare HMO | Admitting: Gastroenterology

## 2019-07-13 VITALS — BP 132/70 | HR 80 | Temp 97.6°F | Ht 64.0 in | Wt 115.0 lb

## 2019-07-13 DIAGNOSIS — K219 Gastro-esophageal reflux disease without esophagitis: Secondary | ICD-10-CM | POA: Diagnosis not present

## 2019-07-13 MED ORDER — FAMOTIDINE 20 MG PO TABS: 60.0000 mg | ORAL_TABLET | Freq: Every day | ORAL | 11 refills | Status: DC

## 2019-07-13 MED ORDER — PANTOPRAZOLE SODIUM 40 MG PO TBEC: DELAYED_RELEASE_TABLET | ORAL | 11 refills | Status: DC

## 2019-07-13 NOTE — Patient Instructions (Addendum)
If you are age 84 or older, your body mass index should be between 23-30. Your Body mass index is 19.74 kg/m. If this is out of the aforementioned range listed, please consider follow up with your Primary Care Provider.  If you are age 1 or younger, your body mass index should be between 19-25. Your Body mass index is 19.74 kg/m. If this is out of the aformentioned range listed, please consider follow up with your Primary Care Provider.   We have sent the following medications to your pharmacy for you to pick up at your convenience:  STOP: omeprazole START: pantoprazole 40 mg one tablet before breakfast  INCREASE: pepcid to 60mg  at bedtime  Follow up with Dr Ardis Hughs on 08/25/19 at 9:50am  Thank you, Dr Ardis Hughs

## 2019-07-13 NOTE — Progress Notes (Signed)
Review of pertinent gastrointestinal problems:  1. Family history for colon cancer (diagnosed in her late 87s), personal history of tubular adenomas. Last colonoscopy November 2007 by Dr. Lyla Son, tortuous although normal. Next colonoscopy November 2012. Was scheduled for colonoscopy 2012 but she had difficulty with the prep (moviprep) and she canceled it. 10/2011 Colonoscopy Dr. Ardis Hughs with different prep; normal except small hemorrhoids. Recommended no further CRC screening given age. 2. Chronic gastroesophageal reflux disease. Has been on a variety of anti-acid, on a variety of PPIs with mixed results. esophagogastroduodenoscopy January 2005 by Dr. Lyla Son. No esophagitis but a Schatzki's ring was seen. Hyperplastic polyps in the gastric body. EGD February 2008 by Dr. Ardis Hughs was normal except for previously sampled hyperplastic-appearing gastric polyps.  3. Chronic dyspepsia, somewhat GERD like. Repeat EGD Dr. Ardis Hughs 12/2014 found mild gastritis, h. Pylori negative. Follow up CT scan 12/2014 was unrevealing. Improved on omeprazole.2017/2018 intermittently improves on variety of PPI trials.    HPI: This is a very pleasant 86 year old woman whom  I last saw her August 2020.  She was still grieving about her husband's passing February 2020.  She was having some dizziness, weakness nausea and shortness of breath.  I was not really sure that this was just a GI issue.  I ordered a chest x-ray which was normal.  CBC, urinalysis as well as complete metabolic profile were all essentially normal.  I recommended that she get into see her primary care physician to discuss her symptoms further.  She has been taking omeprazole 40 mg before breakfast and before dinner.  She has been taking Pepcid at bedtime every night.  On this regimen she is still bothered by breakthrough acid symptoms with burning and acid taste in her mouth especially overnight.  She eats dinner early but does have a snack shortly  before she lays down for bed.  She does not drink alcohol and has very little caffeine.  She has had no dysphagia.   Chief complaint is GERD  ROS: complete GI ROS as described in HPI, all other review negative.  Constitutional:  No unintentional weight loss   Past Medical History:  Diagnosis Date  . Arthritis   . Bladder infection   . Cancer, skin, squamous cell   . Colon polyps   . Frozen shoulder    left   . Gallstones   . GERD (gastroesophageal reflux disease)   . Hiatal hernia   . IBS (irritable bowel syndrome)   . Osteopenia   . Restless leg syndrome     Past Surgical History:  Procedure Laterality Date  . APPENDECTOMY    . CESAREAN SECTION    . CHOLECYSTECTOMY    . MOHS SURGERY Left    scalp- SCC    Current Outpatient Medications  Medication Sig Dispense Refill  . amLODipine (NORVASC) 2.5 MG tablet TAKE 1 TABLET BY MOUTH EVERY DAY    . Calcium-Vitamin D-Vitamin K (VIACTIV) [id number] MG-UNT-MCG CHEW Chew 2 each by mouth daily.      . famotidine (PEPCID) 20 MG tablet Take 2 tablets (40 mg total) by mouth daily. 60 tablet 11  . omeprazole (PRILOSEC) 40 MG capsule Take 1 capsule (40 mg total) by mouth 2 (two) times daily. 60 capsule 3   No current facility-administered medications for this visit.    Allergies as of 07/13/2019 - Review Complete 07/13/2019  Allergen Reaction Noted  . Nitroglycerin Other (See Comments) 11/16/2015  . Sucralfate Other (See Comments) 05/22/2017    Family History  Problem Relation Age of Onset  . Colon cancer Mother 35  . Heart disease Father        MI  . Heart disease Brother   . Lung cancer Brother        x3  . Diabetes Brother   . Hypertension Brother   . Bone cancer Sister   . Lung cancer Sister        remission  . Throat cancer Brother   . Diabetes Maternal Grandmother   . Colon polyps Neg Hx   . Liver disease Neg Hx   . Kidney disease Neg Hx     Social History   Socioeconomic History  . Marital status:  Married    Spouse name: Not on file  . Number of children: 2  . Years of education: Not on file  . Highest education level: Not on file  Occupational History  . Occupation: retired  Tobacco Use  . Smoking status: Never Smoker  . Smokeless tobacco: Never Used  Substance and Sexual Activity  . Alcohol use: Yes    Alcohol/week: 0.0 standard drinks    Comment: occasionally wine  . Drug use: No  . Sexual activity: Not on file  Other Topics Concern  . Not on file  Social History Narrative  . Not on file   Social Determinants of Health   Financial Resource Strain:   . Difficulty of Paying Living Expenses: Not on file  Food Insecurity:   . Worried About Charity fundraiser in the Last Year: Not on file  . Ran Out of Food in the Last Year: Not on file  Transportation Needs:   . Lack of Transportation (Medical): Not on file  . Lack of Transportation (Non-Medical): Not on file  Physical Activity:   . Days of Exercise per Week: Not on file  . Minutes of Exercise per Session: Not on file  Stress:   . Feeling of Stress : Not on file  Social Connections:   . Frequency of Communication with Friends and Family: Not on file  . Frequency of Social Gatherings with Friends and Family: Not on file  . Attends Religious Services: Not on file  . Active Member of Clubs or Organizations: Not on file  . Attends Archivist Meetings: Not on file  . Marital Status: Not on file  Intimate Partner Violence:   . Fear of Current or Ex-Partner: Not on file  . Emotionally Abused: Not on file  . Physically Abused: Not on file  . Sexually Abused: Not on file     Physical Exam: BP 132/70   Pulse 80   Temp 97.6 F (36.4 C)   Ht 5\' 4"  (1.626 m)   Wt 115 lb (52.2 kg)   BMI 19.74 kg/m  Constitutional: generally well-appearing Psychiatric: alert and oriented x3 Abdomen: soft, nontender, nondistended, no obvious ascites, no peritoneal signs, normal bowel sounds No peripheral edema noted in  lower extremities  Assessment and plan: 86 y.o. female with GERD without alarm symptoms  Going to change her antiacid coverage.  Omeprazole does not seem to be helping as well as pantoprazole did not so we will stop the omeprazole and start pantoprazole 40 mg 1 pill shortly before breakfast every morning.  I am going to increase her H2 blocker coverage at bedtime.  She is currently on famotidine 40 mg at bedtime and she is still bothered by intermittent GERD symptoms in the middle the night.  She will start taking 60 mg  of famotidine instead.  She will return to see me in 6 weeks and sooner if any problems.  Please see the "Patient Instructions" section for addition details about the plan.  Owens Loffler, MD [facility] 07/13/2019, 10:04 AM   Total time on date of encounter was 30 minutes (this included time spent preparing to see the patient reviewing records; obtaining and/or reviewing separately obtained history; performing a medically appropriate exam and/or evaluation; counseling and educating the patient and family if present; ordering medications, tests or procedures if applicable; and documenting clinical information in the health record).

## 2019-08-25 ENCOUNTER — Ambulatory Visit: Payer: Medicare HMO | Admitting: Gastroenterology

## 2019-08-25 ENCOUNTER — Encounter: Payer: Self-pay | Admitting: Gastroenterology

## 2019-08-25 VITALS — BP 140/68 | HR 84 | Temp 98.0°F | Ht 64.0 in | Wt 114.8 lb

## 2019-08-25 DIAGNOSIS — K219 Gastro-esophageal reflux disease without esophagitis: Secondary | ICD-10-CM

## 2019-08-25 DIAGNOSIS — R1013 Epigastric pain: Secondary | ICD-10-CM | POA: Diagnosis not present

## 2019-08-25 MED ORDER — PANTOPRAZOLE SODIUM 40 MG PO TBEC: DELAYED_RELEASE_TABLET | ORAL | 11 refills | Status: DC

## 2019-08-25 MED ORDER — CLONAZEPAM 0.25 MG PO TBDP: 0.2500 mg | ORAL_TABLET | Freq: Two times a day (BID) | ORAL | 0 refills | Status: DC

## 2019-08-25 MED ORDER — FAMOTIDINE 40 MG PO TABS: 40.0000 mg | ORAL_TABLET | Freq: Every day | ORAL | 11 refills | Status: DC

## 2019-08-25 NOTE — Patient Instructions (Addendum)
If you are age 84 or older, your body mass index should be between 23-30. Your Body mass index is 19.71 kg/m. If this is out of the aforementioned range listed, please consider follow up with your Primary Care Provider.  If you are age 84 or younger, your body mass index should be between 19-25. Your Body mass index is 19.71 kg/m. If this is out of the aformentioned range listed, please consider follow up with your Primary Care Provider.   We have sent the following medications to your pharmacy for you to pick up at your convenience:  START: Clonazepam 0.25 mg one tablet twice daily  Take: Protonix 40mg  take 1 tablet 30 minutes before breakfast and 1 tablet 30 minutes before dinner  TAKE: Pepcid 40mg  one tablet at bedtime each night  You are scheduled for a follow up appointment on 10-27-19 @ 9:50am.  Thank you for entrusting me with your care and choosing [facility].  Dr Ardis Hughs

## 2019-08-25 NOTE — Progress Notes (Signed)
Review of pertinent gastrointestinal problems:  1. Family history for colon cancer (diagnosed in her late 35s), personal history of tubular adenomas. Last colonoscopy November 2007 by Dr. Lyla Son, tortuous although normal. Next colonoscopy November 2012. Was scheduled for colonoscopy 2012 but she had difficulty with the prep (moviprep) and she canceled it. 10/2011 Colonoscopy Dr. Ardis Hughs with different prep; normal except small hemorrhoids. Recommended no further CRC screening given age. 2. Chronic gastroesophageal reflux disease. Has been on a variety of anti-acid, on a variety of PPIs with mixed results. esophagogastroduodenoscopy January 2005 by Dr. Lyla Son. No esophagitis but a Schatzki's ring was seen. Hyperplastic polyps in the gastric body. EGD February 2008 by Dr. Ardis Hughs was normal except for previously sampled hyperplastic-appearing gastric polyps.  3. Chronic dyspepsia, somewhat GERD like. Repeat EGD Dr. Ardis Hughs 12/2014 found mild gastritis, h. Pylori negative. Follow up CT scan 12/2014 was unrevealing. Improved on omeprazole.2017/2018 intermittently improves on variety of PPI trials.   HPI: This is a very pleasant 86 year old woman   I last saw her about 6 or 8 weeks ago.  She was bothered by breakthrough acid symptoms with burning and acid taste in her mouth especially overnight.  She was eating a snack shortly before laying down for bed.  She was taking proton pump inhibitor omeprazole twice daily and H2 blocker at bedtime.  I decided to change her omeprazole to pantoprazole, 40 mg once daily shortly before her breakfast meal.  I increased her famotidine to 60 mg at bedtime nightly.  She is still bothered by acid regurg especially in the morning.  She increase her Protonix and that she was taking it twice a day before breakfast and dinner.  She has been taking Pepcid 40 mg at bedtime.  She has cataracts and it sounds like also glaucoma.  She is planning to have surgery on her  right or left eye this week with the other one done 2 weeks after that.  She is admittedly very stressed out.  She has not really been this way since her husband passed although admittedly she was an anxious person even prior to that.  I have known her for quite some time.  She has no dysphagia or unintentional weight loss.  No overt GI bleeding.   ROS: complete GI ROS as described in HPI, all other review negative.  Constitutional:  No unintentional weight loss   Past Medical History:  Diagnosis Date  . Arthritis   . Bladder infection   . Cancer, skin, squamous cell   . Colon polyps   . Frozen shoulder    left   . Gallstones   . GERD (gastroesophageal reflux disease)   . Hiatal hernia   . IBS (irritable bowel syndrome)   . Osteopenia   . Restless leg syndrome     Past Surgical History:  Procedure Laterality Date  . APPENDECTOMY    . CESAREAN SECTION    . CHOLECYSTECTOMY    . MOHS SURGERY Left    scalp- SCC    Current Outpatient Medications  Medication Sig Dispense Refill  . amLODipine (NORVASC) 2.5 MG tablet TAKE 1 TABLET BY MOUTH EVERY DAY    . Calcium-Vitamin D-Vitamin K (VIACTIV) [id number] MG-UNT-MCG CHEW Chew 2 each by mouth daily.      . famotidine (PEPCID) 20 MG tablet Take 3 tablets (60 mg total) by mouth daily. 90 tablet 11  . pantoprazole (PROTONIX) 40 MG tablet Take 1 tablet before breakfast 30 tablet 11   No current facility-administered  medications for this visit.    Allergies as of 08/25/2019 - Review Complete 08/25/2019  Allergen Reaction Noted  . Nitroglycerin Other (See Comments) 11/16/2015  . Sucralfate Other (See Comments) 05/22/2017    Family History  Problem Relation Age of Onset  . Colon cancer Mother 49  . Heart disease Father        MI  . Heart disease Brother   . Lung cancer Brother        x3  . Diabetes Brother   . Hypertension Brother   . Bone cancer Sister   . Lung cancer Sister        remission  . Throat cancer Brother    . Diabetes Maternal Grandmother   . Colon polyps Neg Hx   . Liver disease Neg Hx   . Kidney disease Neg Hx     Social History   Socioeconomic History  . Marital status: Married    Spouse name: Not on file  . Number of children: 2  . Years of education: Not on file  . Highest education level: Not on file  Occupational History  . Occupation: retired  Tobacco Use  . Smoking status: Never Smoker  . Smokeless tobacco: Never Used  Substance and Sexual Activity  . Alcohol use: Yes    Alcohol/week: 0.0 standard drinks    Comment: occasionally wine  . Drug use: No  . Sexual activity: Not on file  Other Topics Concern  . Not on file  Social History Narrative  . Not on file   Social Determinants of Health   Financial Resource Strain:   . Difficulty of Paying Living Expenses: Not on file  Food Insecurity:   . Worried About Charity fundraiser in the Last Year: Not on file  . Ran Out of Food in the Last Year: Not on file  Transportation Needs:   . Lack of Transportation (Medical): Not on file  . Lack of Transportation (Non-Medical): Not on file  Physical Activity:   . Days of Exercise per Week: Not on file  . Minutes of Exercise per Session: Not on file  Stress:   . Feeling of Stress : Not on file  Social Connections:   . Frequency of Communication with Friends and Family: Not on file  . Frequency of Social Gatherings with Friends and Family: Not on file  . Attends Religious Services: Not on file  . Active Member of Clubs or Organizations: Not on file  . Attends Archivist Meetings: Not on file  . Marital Status: Not on file  Intimate Partner Violence:   . Fear of Current or Ex-Partner: Not on file  . Emotionally Abused: Not on file  . Physically Abused: Not on file  . Sexually Abused: Not on file     Physical Exam: BP 140/68   Pulse 84   Temp 98 F (36.7 C)   Ht 5\' 4"  (1.626 m)   Wt 114 lb 12.8 oz (52.1 kg)   BMI 19.71 kg/m  Constitutional:  generally well-appearing Psychiatric: alert and oriented x3 Abdomen: soft, nontender, nondistended, no obvious ascites, no peritoneal signs, normal bowel sounds No peripheral edema noted in lower extremities  Assessment and plan: 86 y.o. female with chronic GERD, anxiety  I would have expected her GI issues to have settled a bit on twice daily proton pump inhibitor and bedtime H2 blocker.  They have not and so I discussed with her today repeating an upper endoscopy.  She is having  ophthalmologic surgery later this week and then the other eye being done about 2 weeks for what sounds like cataracts and glaucoma.  I do not think now is the correct time for upper endoscopies.  She is admittedly very stressed out as well.  I have known her for many years and I can tell she is just really very anxious since her husband passed and with some ongoing health issues of her own.  She asked about antianxiety medicine and I am happy to do that for her on a limited 1 month basis for now.  I am prescribing her clonazepam 0.25 mg pills she will take 1 pill twice daily To her that if she in general feels much better afterwards that this should then be discussed with her primary care physician about refilling on a more chronic basis.  She will return to see me in 2 months which should give her some time to recover from her ophthalmologic surgeries and at that point if she is still very bothered by GI issues then I would consider repeating upper endoscopy.  Please see the "Patient Instructions" section for addition details about the plan.  Owens Loffler, MD [facility] 08/25/2019, 9:51 AM   Total time on date of encounter was 30 minutes (this included time spent preparing to see the patient reviewing records; obtaining and/or reviewing separately obtained history; performing a medically appropriate exam and/or evaluation; counseling and educating the patient and family if present; ordering medications,  tests or procedures if applicable; and documenting clinical information in the health record).

## 2019-10-27 ENCOUNTER — Ambulatory Visit: Payer: Medicare HMO | Admitting: Gastroenterology

## 2019-12-10 ENCOUNTER — Ambulatory Visit: Payer: Medicare HMO | Admitting: Gastroenterology

## 2019-12-10 ENCOUNTER — Encounter: Payer: Self-pay | Admitting: Gastroenterology

## 2019-12-10 VITALS — BP 128/64 | HR 87 | Ht 64.5 in | Wt 119.4 lb

## 2019-12-10 DIAGNOSIS — K219 Gastro-esophageal reflux disease without esophagitis: Secondary | ICD-10-CM

## 2019-12-10 DIAGNOSIS — R1013 Epigastric pain: Secondary | ICD-10-CM | POA: Diagnosis not present

## 2019-12-10 NOTE — Progress Notes (Signed)
Review of pertinent gastrointestinal problems:  1. Family history for colon cancer (diagnosed in her late 110s), personal history of tubular adenomas. Last colonoscopy November 2007 by Dr. Lyla Son, tortuous although normal. Next colonoscopy November 2012. Was scheduled for colonoscopy 2012 but she had difficulty with the prep (moviprep) and she canceled it. 10/2011 Colonoscopy Dr. Ardis Hughs with different prep; normal except small hemorrhoids. Recommended no further CRC screening given age. 2. Chronic gastroesophageal reflux disease. Has been on a variety of anti-acid, on a variety of PPIs with mixed results. esophagogastroduodenoscopy January 2005 by Dr. Lyla Son. No esophagitis but a Schatzki's ring was seen. Hyperplastic polyps in the gastric body. EGD February 2008 by Dr. Ardis Hughs was normal except for previously sampled hyperplastic-appearing gastric polyps.  3. Chronic dyspepsia, somewhat GERD like. Repeat EGD Dr. Ardis Hughs 12/2014 found mild gastritis, h. Pylori negative. Follow up CT scan 12/2014 was unrevealing. Improved on omeprazole.2017/2018 intermittently improves on variety of PPI trials.   HPI: This is a very pleasant 86 year old woman  I last saw her about 3 months ago.  Prior to that I had seen her 2 months prior.  She was having a lot of problems with acid regurgitation.  She is admittedly very stressed out as well.  I had known her for many years and it was pretty clear that anxiety and stress was playing a large role in her symptoms and I actually prescribed clonazepam 0.25 mg pills 1 pill twice daily.  Explained to her that if this helped her that she should discuss with her primary care physician about getting more chronic prescription for it.  Since starting the Klonopin low-dose twice daily she feels so much better.  Her stress level and her anxieties have really gotten much better.  Her GI symptoms have also significantly improved.  She is very happy about this.  Her primary  care physician has started writing the prescription.  She takes a pantoprazole shortly before breakfast and oftentimes she takes a Pepcid at bedtime.   ROS: complete GI ROS as described in HPI, all other review negative.  Constitutional:  No unintentional weight loss   Past Medical History:  Diagnosis Date  . Arthritis   . Bladder infection   . Cancer, skin, squamous cell   . Colon polyps   . Frozen shoulder    left   . Gallstones   . GERD (gastroesophageal reflux disease)   . Hiatal hernia   . IBS (irritable bowel syndrome)   . Osteopenia   . Restless leg syndrome     Past Surgical History:  Procedure Laterality Date  . APPENDECTOMY    . CATARACT EXTRACTION Bilateral    April and May 2021  . CESAREAN SECTION    . CHOLECYSTECTOMY    . MOHS SURGERY Left    scalp- SCC    Current Outpatient Medications  Medication Sig Dispense Refill  . amLODipine (NORVASC) 2.5 MG tablet TAKE 1 TABLET BY MOUTH EVERY DAY    . Calcium-Vitamin D-Vitamin K (VIACTIV) 536-144-31 MG-UNT-MCG CHEW Chew 2 each by mouth daily.      . clonazePAM (KLONOPIN) 0.25 MG disintegrating tablet Take 1 tablet (0.25 mg total) by mouth 2 (two) times daily. 60 tablet 0  . famotidine (PEPCID) 40 MG tablet Take 1 tablet (40 mg total) by mouth at bedtime. 30 tablet 11  . pantoprazole (PROTONIX) 40 MG tablet Take 1 tablet 30 minutes before breakfast and 1 tablet 30 minutes before dinner (Patient taking differently: Take 40 mg by mouth  daily. Take 1 tablet 30 minutes before breakfast) 60 tablet 11   No current facility-administered medications for this visit.    Allergies as of 12/10/2019 - Review Complete 12/10/2019  Allergen Reaction Noted  . Nitroglycerin Other (See Comments) 11/16/2015  . Sucralfate Other (See Comments) 05/22/2017    Family History  Problem Relation Age of Onset  . Colon cancer Mother 32  . Heart disease Father        MI  . Heart disease Brother   . Lung cancer Brother        x3  .  Diabetes Brother   . Hypertension Brother   . Bone cancer Sister   . Lung cancer Sister        remission  . Throat cancer Brother   . Diabetes Maternal Grandmother   . Colon polyps Neg Hx   . Liver disease Neg Hx   . Kidney disease Neg Hx     Social History   Socioeconomic History  . Marital status: Married    Spouse name: Not on file  . Number of children: 2  . Years of education: Not on file  . Highest education level: Not on file  Occupational History  . Occupation: retired  Tobacco Use  . Smoking status: Never Smoker  . Smokeless tobacco: Never Used  Vaping Use  . Vaping Use: Never used  Substance and Sexual Activity  . Alcohol use: Not Currently    Alcohol/week: 0.0 standard drinks    Comment: occasionally wine  . Drug use: No  . Sexual activity: Not on file  Other Topics Concern  . Not on file  Social History Narrative  . Not on file   Social Determinants of Health   Financial Resource Strain:   . Difficulty of Paying Living Expenses:   Food Insecurity:   . Worried About Charity fundraiser in the Last Year:   . Arboriculturist in the Last Year:   Transportation Needs:   . Film/video editor (Medical):   Marland Kitchen Lack of Transportation (Non-Medical):   Physical Activity:   . Days of Exercise per Week:   . Minutes of Exercise per Session:   Stress:   . Feeling of Stress :   Social Connections:   . Frequency of Communication with Friends and Family:   . Frequency of Social Gatherings with Friends and Family:   . Attends Religious Services:   . Active Member of Clubs or Organizations:   . Attends Archivist Meetings:   Marland Kitchen Marital Status:   Intimate Partner Violence:   . Fear of Current or Ex-Partner:   . Emotionally Abused:   Marland Kitchen Physically Abused:   . Sexually Abused:      Physical Exam: BP 128/64   Pulse 87   Ht 5' 4.5" (1.638 m) Comment: with shoes  Wt 119 lb 6 oz (54.1 kg)   BMI 20.17 kg/m  Constitutional: generally  well-appearing Psychiatric: alert and oriented x3 Abdomen: soft, nontender, nondistended, no obvious ascites, no peritoneal signs, normal bowel sounds No peripheral edema noted in lower extremities  Assessment and plan: 86 y.o. female with GERD, dyspepsia, significant anxiety stress component  She is obviously so much happier and feeling better since starting the Klonopin twice daily.  Her GI symptoms have nearly completely resolved.  Her primary care physician has refilled the prescription and I believe will continue refilling it for her.  She knows that if she has any issues with her GI  tract she should call here.  Please see the "Patient Instructions" section for addition details about the plan.  Owens Loffler, MD [facility] 12/10/2019, 3:03 PM   Total time on date of encounter was 20 minutes (this included time spent preparing to see the patient reviewing records; obtaining and/or reviewing separately obtained history; performing a medically appropriate exam and/or evaluation; counseling and educating the patient and family if present; ordering medications, tests or procedures if applicable; and documenting clinical information in the health record).

## 2019-12-10 NOTE — Patient Instructions (Signed)
If you are age 84 or older, your body mass index should be between 23-30. Your Body mass index is 20.17 kg/m. If this is out of the aforementioned range listed, please consider follow up with your Primary Care Provider.  If you are age 15 or younger, your body mass index should be between 19-25. Your Body mass index is 20.17 kg/m. If this is out of the aformentioned range listed, please consider follow up with your Primary Care Provider.   You will follow up with our office on an as needed basis.  Thank you for entrusting me with your care and choosing [facility].  Dr Ardis Hughs

## 2019-12-22 ENCOUNTER — Ambulatory Visit: Payer: Medicare HMO | Admitting: Neurology

## 2019-12-22 ENCOUNTER — Encounter: Payer: Self-pay | Admitting: Neurology

## 2019-12-22 VITALS — BP 142/69 | HR 69 | Ht 64.0 in | Wt 120.0 lb

## 2019-12-22 DIAGNOSIS — Z5181 Encounter for therapeutic drug level monitoring: Secondary | ICD-10-CM | POA: Diagnosis not present

## 2019-12-22 DIAGNOSIS — G4489 Other headache syndrome: Secondary | ICD-10-CM | POA: Diagnosis not present

## 2019-12-22 HISTORY — DX: Other headache syndrome: G44.89

## 2019-12-22 MED ORDER — DULOXETINE HCL 30 MG PO CPEP: 30.0000 mg | ORAL_CAPSULE | Freq: Every day | ORAL | 3 refills | Status: DC

## 2019-12-22 NOTE — Progress Notes (Signed)
Reason for visit: Headache  Referring physician: Dr. [name] is a 86 y.o. female  History of present illness:  [name] is an 86 year old right-handed white female with a history of headaches that began at least 6 months ago.  The headache began with sharp pains behind the left eye that would come and go.  The patient was seen by her ophthalmologist but no intraocular disease was noted, the patient was told that she had a narrow angle but did not have glaucoma.  The patient began having generalized headaches in March 2021.  She described the headaches as a bandlike sensation of pressure around her head that would be associated with some dizziness and some nausea.  At times the headaches were quite severe but more recently the headaches have become more mild but are still persistent and daily in nature.  She will have a dull headache throughout the day.  This is unassociated with photophobia or phonophobia.  The patient may have occasional tingling in the fingers but otherwise no sensory changes and she denies any weakness of the extremities.  She does have some mild gait instability and she denies any problems controlling the bowels or the bladder.  She denies significant neck pain.  She has not had any low-grade fevers or malaise or weight loss.  She has not had any loss of vision but she does report a dark spot in the nasal visual field of the left eye.  She has had cataract surgery previously.  She indicates that she never really had significant problems with headaches as she was growing up, her son does have a history of migraine.  She is sent to this office for further evaluation.  She denies any activating factors for her headache.  Past Medical History:  Diagnosis Date  . Arthritis   . Bladder infection   . Cancer, skin, squamous cell   . Colon polyps   . Frozen shoulder    left   . Gallstones   . GERD (gastroesophageal reflux disease)   . Headache syndrome  12/22/2019  . Hiatal hernia   . IBS (irritable bowel syndrome)   . Osteopenia   . Restless leg syndrome     Past Surgical History:  Procedure Laterality Date  . APPENDECTOMY    . CATARACT EXTRACTION Bilateral    April and May 2021  . CESAREAN SECTION    . CHOLECYSTECTOMY    . MOHS SURGERY Left    scalp- SCC    Family History  Problem Relation Age of Onset  . Colon cancer Mother 45  . Heart disease Father        MI  . Heart disease Brother   . Lung cancer Brother        x3  . Diabetes Brother   . Hypertension Brother   . Bone cancer Sister   . Lung cancer Sister        remission  . Throat cancer Brother   . Diabetes Maternal Grandmother   . Colon polyps Neg Hx   . Liver disease Neg Hx   . Kidney disease Neg Hx     Social history:  reports that she has never smoked. She has never used smokeless tobacco. She reports previous alcohol use. She reports that she does not use drugs.  Medications:  Prior to Admission medications   Medication Sig Start Date End Date Taking? Authorizing Provider  amLODipine (NORVASC) 2.5 MG tablet TAKE 1 TABLET  BY MOUTH EVERY DAY 10/13/17  Yes [provider]  amLODipine (NORVASC) 2.5 MG tablet Take 1 tablet by mouth daily. 10/04/19  Yes [provider]  Calcium-Vitamin D-Vitamin K (VIACTIV) [id number] MG-UNT-MCG CHEW Chew 2 each by mouth daily.     Yes [provider]  clonazePAM (KLONOPIN) 0.25 MG disintegrating tablet Take 1 tablet (0.25 mg total) by mouth 2 (two) times daily. 08/25/19  Yes Milus Banister, MD  famotidine (PEPCID) 40 MG tablet Take 1 tablet (40 mg total) by mouth at bedtime. 08/25/19 12/22/19 Yes Milus Banister, MD  pantoprazole (PROTONIX) 40 MG tablet Take 1 tablet 30 minutes before breakfast and 1 tablet 30 minutes before dinner Patient taking differently: Take 40 mg by mouth daily. Take 1 tablet 30 minutes before breakfast 08/25/19  Yes Milus Banister, MD      Allergies  Allergen Reactions  .  Nitroglycerin Other (See Comments)    Passed out  . Sucralfate Other (See Comments)    Patient states that it makes her stomach cramp.    ROS:  Out of a complete 14 system review of symptoms, the patient complains only of the following symptoms, and all other reviewed systems are negative.  Headache Left eye pain Shortness of breath Urination problems Feeling cold Joint pain Dizziness Anxiety, not enough sleep, decreased energy Sleepiness, restless legs  Blood pressure (!) 142/69, pulse 69, height 5\' 4"  (1.626 m), weight 120 lb (54.4 kg).  Physical Exam  General: The patient is alert and cooperative at the time of the examination.  Eyes: Pupils are equal, round, and reactive to light. Discs are flat bilaterally.  Neck: The neck is supple, no carotid bruits are noted.  Respiratory: The respiratory examination is clear.  Cardiovascular: The cardiovascular examination reveals a regular rate and rhythm, no obvious murmurs or rubs are noted.  Neuromuscular: The patient lacks about 25 degrees of full lateral rotation of the cervical spine bilaterally.  No crepitus was noted in the temporomandibular joints and no tenderness with palpation of the temporal regions was noted.  Skin: Extremities are without significant edema.  Neurologic Exam  Mental status: The patient is alert and oriented x 3 at the time of the examination. The patient has apparent normal recent and remote memory, with an apparently normal attention span and concentration ability.  Cranial nerves: Facial symmetry is present. There is good sensation of the face to pinprick and soft touch bilaterally. The strength of the facial muscles and the muscles to head turning and shoulder shrug are normal bilaterally. Speech is well enunciated, no aphasia or dysarthria is noted. Extraocular movements are full. Visual fields are full. The tongue is midline, and the patient has symmetric elevation of the soft palate. No obvious  hearing deficits are noted.  Motor: The motor testing reveals 5 over 5 strength of all 4 extremities. Good symmetric motor tone is noted throughout.  Sensory: Sensory testing is intact to pinprick, soft touch, vibration sensation, and position sense on all 4 extremities. No evidence of extinction is noted.  Coordination: Cerebellar testing reveals good finger-nose-finger and heel-to-shin bilaterally.  Gait and station: Gait is normal. Tandem gait is normal. Romberg is negative. No drift is seen.  Reflexes: Deep tendon reflexes are symmetric and normal bilaterally. Toes are downgoing bilaterally.   Assessment/Plan:  1.  Headache, new onset  The patient has had onset of daily headaches within the last 6 months that began with a more focal headache around the left eye.  The  patient has been under some stress with the loss of her husband.  The more generalized bandlike headaches may be tension type headache.  The patient will be sent for blood work today, she will have MRI of the brain.  We will get a carotid Doppler study.  She will be placed on low-dose Cymbalta, she will call for any dose adjustments.  She will follow-up here in 3 months.  Jill Alexanders MD 12/22/2019 9:04 AM  [facility] [address] [place], [place] [address]  Phone [phone] Fax [phone]

## 2019-12-22 NOTE — Patient Instructions (Signed)
We will start cymbalta for headache prevention.  Cymbalta (duloxetine) is an antidepressant medication that is commonly used for peripheral neuropathy pain or for fibromyalgia pain. As with any antidepressant medication, worsening depression can be seen. This medication can potentially cause headache, dizziness, sexual dysfunction, or nausea. If any problems are noted on this medication, please contact our office.

## 2019-12-23 ENCOUNTER — Telehealth: Payer: Self-pay | Admitting: Neurology

## 2019-12-23 LAB — BASIC METABOLIC PANEL
BUN/Creatinine Ratio: 12 (ref 12–28)
BUN: 9 mg/dL (ref 8–27)
CO2: 28 mmol/L (ref 20–29)
Calcium: 9.6 mg/dL (ref 8.7–10.3)
Chloride: 95 mmol/L — ABNORMAL LOW (ref 96–106)
Creatinine, Ser: 0.73 mg/dL (ref 0.57–1.00)
GFR calc Af Amer: 87 mL/min/{1.73_m2} (ref >59)
GFR calc non Af Amer: 76 mL/min/{1.73_m2} (ref >59)
Glucose: 93 mg/dL (ref 65–99)
Potassium: 4.7 mmol/L (ref 3.5–5.2)
Sodium: 134 mmol/L (ref 134–144)

## 2019-12-23 LAB — SEDIMENTATION RATE: Sed Rate: 2 mm/hr (ref 0–40)

## 2019-12-23 LAB — C-REACTIVE PROTEIN: CRP: 2 mg/L (ref 0–10)

## 2019-12-23 NOTE — Telephone Encounter (Signed)
[employer] medicare order sent to [facility]. They will obtain the auth and reach out to the patient to schedule.  °

## 2019-12-27 ENCOUNTER — Telehealth: Payer: Self-pay

## 2019-12-27 NOTE — Telephone Encounter (Signed)
-----   Message from Kathrynn Ducking, MD sent at 12/23/2019  7:04 AM EDT ----- Blood work is unremarkable, measures of information of the body are normal, chemistry panel is unremarkable with exception of minimally low chloride level, of no clinical significance.  Please call the patient. ----- Message ----- From: Lavone Neri Lab Results In Sent: 12/23/2019   5:40 AM EDT To: Kathrynn Ducking, MD

## 2019-12-27 NOTE — Telephone Encounter (Signed)
I called pt that blood work was unremarkable except minimally low chloride.She verbalized understanding.

## 2020-01-04 ENCOUNTER — Encounter (HOSPITAL_COMMUNITY): Payer: Medicare HMO

## 2020-01-22 ENCOUNTER — Ambulatory Visit
Admission: RE | Admit: 2020-01-22 | Discharge: 2020-01-22 | Disposition: A | Discharge status: Home / Self Care | Payer: Medicare HMO | Source: Ambulatory Visit | Attending: Neurology | Admitting: Neurology

## 2020-01-22 ENCOUNTER — Other Ambulatory Visit: Payer: Self-pay

## 2020-01-22 DIAGNOSIS — G4489 Other headache syndrome: Secondary | ICD-10-CM

## 2020-01-22 MED ORDER — GADOBENATE DIMEGLUMINE 529 MG/ML IV SOLN
11.0000 mL | Freq: Once | INTRAVENOUS | Status: AC | PRN
  Administered 2020-01-22: 11 mL via INTRAVENOUS

## 2020-01-25 ENCOUNTER — Telehealth: Payer: Self-pay | Admitting: Neurology

## 2020-01-25 NOTE — Telephone Encounter (Signed)
I called the patient.  MRI of the brain shows mild small vessel disease, nothing that would result in headaches.  A carotid Doppler study is pending.  The patient was placed on Cymbalta for the headache.    MRI brain 01/25/20:  IMPRESSION: This MRI of the brain without contrast shows the following: 1.   There were no acute findings. 2.   Scattered T2/FLAIR hyperintense foci in the hemispheres consistent with chronic microvascular ischemic change.  None of the foci appear to be acute. 3.   Mild to moderate generalized cortical atrophy.

## 2020-01-31 ENCOUNTER — Telehealth: Payer: Self-pay | Admitting: Neurology

## 2020-01-31 NOTE — Telephone Encounter (Signed)
FYI-Pt called wanting to inform the provider that she will not be getting the Carotid test done due to her being on a fixed income and the test's being expensive. Pt states that she does not feel the need to get it done since she has already had the MRI done.

## 2020-01-31 NOTE — Telephone Encounter (Signed)
Events noted

## 2020-03-21 ENCOUNTER — Other Ambulatory Visit: Payer: Self-pay

## 2020-03-21 MED ORDER — PANTOPRAZOLE SODIUM 40 MG PO TBEC: 40.0000 mg | DELAYED_RELEASE_TABLET | Freq: Every day | ORAL | 6 refills | Status: DC

## 2020-04-03 ENCOUNTER — Telehealth: Payer: Self-pay | Admitting: Gastroenterology

## 2020-04-03 MED ORDER — PANTOPRAZOLE SODIUM 40 MG PO TBEC: 40.0000 mg | DELAYED_RELEASE_TABLET | Freq: Two times a day (BID) | ORAL | 3 refills | Status: DC

## 2020-04-03 NOTE — Telephone Encounter (Signed)
Patient aware that script has been sent to pharmacy.

## 2020-04-03 NOTE — Telephone Encounter (Signed)
Patient states that she has been taking pantoprazole 40mg  one tablet twice daily.  She would like script to reflect how she has been taking medication.  Is it OK to send in increased dosage?

## 2020-04-03 NOTE — Telephone Encounter (Signed)
Certainly, thanks  1 year of refills is ok as well

## 2020-05-25 ENCOUNTER — Ambulatory Visit: Payer: Medicare HMO | Attending: Internal Medicine

## 2020-05-25 DIAGNOSIS — Z23 Encounter for immunization: Secondary | ICD-10-CM

## 2020-05-25 NOTE — Progress Notes (Signed)
   Covid-19 Vaccination Clinic  Name:  Madison [name]    MRN: [id number] DOB: [birth year]  05/25/2020  Madison [name] was observed post Covid-19 immunization for 15 minutes without incident. She was provided with Vaccine Information Sheet and instruction to access the V-Safe system.   Madison [name] was instructed to call 911 with any severe reactions post vaccine: Marland Kitchen Difficulty breathing  . Swelling of face and throat  . A fast heartbeat  . A bad rash all over body  . Dizziness and weakness   Immunizations Administered    Name Date Dose VIS Date Route   Pfizer COVID-19 Vaccine 05/25/2020  2:24 PM 0.3 mL 04/05/2020 Intramuscular   Manufacturer: [place]   Lot: [id number]   [place]: [address]-3

## 2020-08-07 ENCOUNTER — Other Ambulatory Visit: Payer: Self-pay

## 2020-08-07 MED ORDER — FAMOTIDINE 40 MG PO TABS: 40.0000 mg | ORAL_TABLET | Freq: Every day | ORAL | 1 refills | Status: DC

## 2020-08-07 NOTE — Progress Notes (Signed)
Refill of Pepcid 40 mg qhs sent to [facility] in [place], [place]

## 2021-01-24 ENCOUNTER — Other Ambulatory Visit: Payer: Self-pay | Admitting: Gastroenterology

## 2021-01-30 ENCOUNTER — Ambulatory Visit: Payer: Medicare HMO | Admitting: Gastroenterology

## 2021-01-30 ENCOUNTER — Encounter: Payer: Self-pay | Admitting: Gastroenterology

## 2021-01-30 VITALS — BP 122/70 | HR 81 | Ht 63.5 in | Wt 121.6 lb

## 2021-01-30 DIAGNOSIS — K589 Irritable bowel syndrome without diarrhea: Secondary | ICD-10-CM

## 2021-01-30 MED ORDER — ALIGN PO CAPS
1.0000 | ORAL_CAPSULE | Freq: Every day | ORAL | 0 refills | Status: DC
Start: 2021-01-30 — End: 2021-04-17

## 2021-01-30 NOTE — Patient Instructions (Addendum)
If you are age 85 or older, your body mass index should be between 23-30. Your Body mass index is 21.2 kg/m. If this is out of the aforementioned range listed, please consider follow up with your Primary Care Provider.  If you are age 53 or younger, your body mass index should be between 19-25. Your Body mass index is 21.2 kg/m. If this is out of the aformentioned range listed, please consider follow up with your Primary Care Provider.   __________________________________________________________  The [facility] providers would like to encourage you to use [facility] to communicate with providers for non-urgent requests or questions.  Due to long hold times on the telephone, sending your provider a message by [facility] may be a faster and more efficient way to get a response.  Please allow 48 business hours for a response.  Please remember that this is for non-urgent requests.    Continue Pepcid and Protonix.  Please purchase the following medications over the counter and take as directed: Align probiotic: Take one capsule once daily.  We have scheduled you for a follow up appointment on Tuesday, 04-10-21 at 10:50 am.   Thank you for entrusting me with your care and for choosing [facility], Dr. Oretha Caprice

## 2021-01-30 NOTE — Progress Notes (Signed)
Review of pertinent gastrointestinal problems:   1. Family history for colon cancer (mother diagnosed in her late 69s), personal history of tubular adenomas. Last colonoscopy November 2007 by Dr. Lyla Son, tortuous although normal. Next colonoscopy November 2012.  Was scheduled for colonoscopy 2012 but she had difficulty with the prep (moviprep) and she canceled it.  10/2011 Colonoscopy Dr. Ardis Hughs with different prep; normal except small hemorrhoids.  Recommended no further CRC screening given age. 2. Chronic gastroesophageal reflux disease. Has been on a variety of anti-acid, on a variety of PPIs with mixed results. esophagogastroduodenoscopy January 2005 by Dr. Lyla Son. No esophagitis but a Schatzki's ring was seen. Hyperplastic polyps in the gastric body. EGD February 2008 by Dr. Ardis Hughs was normal except for previously sampled hyperplastic-appearing gastric polyps.  3. Chronic dyspepsia, somewhat GERD like.  Repeat EGD Dr. Ardis Hughs 12/2014 found mild gastritis, h. Pylori negative. Follow up CT scan 12/2014 was unrevealing.  Improved on omeprazole. 2017/2018 intermittently improves on variety of PPI trials.  Significant improvement on low-dose Klonopin twice daily.   HPI: This is a very pleasant 87 year old woman whom I last saw little over a year ago.   Blood work June 2022 showed hemoglobin 12.1 otherwise normal CBC, complete metabolic profile was normal  Her weight is up 2 pounds since her last office visit here a little over 1 year ago.  Same scale in our office.  She has a lot of mild, vague symptoms including some gassiness, some fleeting constipation, some mild abdominal discomforts.  She says these have all been going on for a few months.  She tells me she added some fruits and salads and raisin bran to her diet and this is seem to help.  She wonders about taking a probiotic.  She is not losing weight.  She is not having severe or significant abdominal pains.  She is not having any overt  GI bleeding.   ROS: complete GI ROS as described in HPI, all other review negative.  Constitutional:  No unintentional weight loss   Past Medical History:  Diagnosis Date   Arthritis    Bladder infection    Cancer, skin, squamous cell    Colon polyps    Frozen shoulder    left    Gallstones    GERD (gastroesophageal reflux disease)    Headache syndrome 12/22/2019   Hiatal hernia    IBS (irritable bowel syndrome)    Osteopenia    Restless leg syndrome     Past Surgical History:  Procedure Laterality Date   APPENDECTOMY     CATARACT EXTRACTION Bilateral    April and May 2021   CESAREAN SECTION     CHOLECYSTECTOMY     MOHS SURGERY Left    scalp- SCC    Current Outpatient Medications  Medication Sig Dispense Refill   amLODipine (NORVASC) 2.5 MG tablet TAKE 1 TABLET BY MOUTH EVERY DAY     Calcium-Vitamin D-Vitamin K 500-100-40 MG-UNT-MCG CHEW Chew 2 each by mouth daily.       clonazePAM (KLONOPIN) 0.25 MG disintegrating tablet Take 1 tablet (0.25 mg total) by mouth 2 (two) times daily. 60 tablet 0   DULoxetine (CYMBALTA) 30 MG capsule Take 1 capsule (30 mg total) by mouth daily. 30 capsule 3   famotidine (PEPCID) 40 MG tablet TAKE 1 TABLET BY MOUTH EVERYDAY AT BEDTIME 90 tablet 1   pantoprazole (PROTONIX) 40 MG tablet Take 1 tablet (40 mg total) by mouth 2 (two) times daily. Take 1 tablet 30  minutes before breakfast and dinner 180 tablet 3   No current facility-administered medications for this visit.    Allergies as of 01/30/2021 - Review Complete 01/30/2021  Allergen Reaction Noted   Nitroglycerin Other (See Comments) 11/16/2015   Sucralfate Other (See Comments) 05/22/2017    Family History  Problem Relation Age of Onset   Colon cancer Mother 56   Heart disease Father        MI   Heart disease Brother    Lung cancer Brother        x3   Diabetes Brother    Hypertension Brother    Bone cancer Sister    Lung cancer Sister        remission   Throat cancer  Brother    Diabetes Maternal Grandmother    Colon polyps Neg Hx    Liver disease Neg Hx    Kidney disease Neg Hx     Social History   Socioeconomic History   Marital status: Married    Spouse name: Not on file   Number of children: 2   Years of education: Not on file   Highest education level: Not on file  Occupational History   Occupation: retired  Tobacco Use   Smoking status: Never   Smokeless tobacco: Never  Vaping Use   Vaping Use: Never used  Substance and Sexual Activity   Alcohol use: Not Currently    Alcohol/week: 0.0 standard drinks    Comment: occasionally wine   Drug use: No   Sexual activity: Not on file  Other Topics Concern   Not on file  Social History Narrative   Not on file   Social Determinants of Health   Financial Resource Strain: Not on file  Food Insecurity: Not on file  Transportation Needs: Not on file  Physical Activity: Not on file  Stress: Not on file  Social Connections: Not on file  Intimate Partner Violence: Not on file     Physical Exam: BP 122/70   Pulse 81   Ht 5' 3.5" (1.613 m)   Wt 121 lb 9.6 oz (55.2 kg)   SpO2 97%   BMI 21.20 kg/m  Constitutional: generally well-appearing Psychiatric: alert and oriented x3 Abdomen: soft, nontender, nondistended, no obvious ascites, no peritoneal signs, normal bowel sounds No peripheral edema noted in lower extremities  Assessment and plan: 87 y.o. female with vague, mild GI symptoms  I told her I am not sure what to make of these symptoms since they seem pretty mild overall.  I did let her know that a trial of probiotic medicine align is certainly reasonable.  I asked her to come back to see me in about 2 months to see how she is responding, make sure she is not feeling worse.  We did briefly discuss testing with blood work, imaging or colonoscopy and she was not very interested in that but she did agree that if she is feeling worse despite conservative measures that she may great  future.  Please see the "Patient Instructions" section for addition details about the plan.  Owens Loffler, MD [facility] 01/30/2021, 3:02 PM   Total time on date of encounter was 35 minutes (this included time spent preparing to see the patient reviewing records; obtaining and/or reviewing separately obtained history; performing a medically appropriate exam and/or evaluation; counseling and educating the patient and family if present; ordering medications, tests or procedures if applicable; and documenting clinical information in the health record).

## 2021-03-12 ENCOUNTER — Other Ambulatory Visit: Payer: Self-pay

## 2021-03-12 MED ORDER — PANTOPRAZOLE SODIUM 40 MG PO TBEC: 40.0000 mg | DELAYED_RELEASE_TABLET | Freq: Two times a day (BID) | ORAL | 0 refills | Status: DC

## 2021-04-10 ENCOUNTER — Ambulatory Visit: Payer: Medicare HMO | Admitting: Gastroenterology

## 2021-04-17 ENCOUNTER — Other Ambulatory Visit (INDEPENDENT_AMBULATORY_CARE_PROVIDER_SITE_OTHER): Payer: Medicare HMO

## 2021-04-17 ENCOUNTER — Encounter: Payer: Self-pay | Admitting: Gastroenterology

## 2021-04-17 ENCOUNTER — Ambulatory Visit: Payer: Medicare HMO | Admitting: Gastroenterology

## 2021-04-17 VITALS — BP 144/66 | HR 64 | Ht 63.5 in | Wt 124.0 lb

## 2021-04-17 DIAGNOSIS — K589 Irritable bowel syndrome without diarrhea: Secondary | ICD-10-CM | POA: Diagnosis not present

## 2021-04-17 LAB — COMPREHENSIVE METABOLIC PANEL
ALT: 13 U/L (ref 0–35)
AST: 16 U/L (ref 0–37)
Albumin: 4.5 g/dL (ref 3.5–5.2)
Alkaline Phosphatase: 60 U/L (ref 39–117)
BUN: 11 mg/dL (ref 6–23)
CO2: 30 mEq/L (ref 19–32)
Calcium: 9.3 mg/dL (ref 8.4–10.5)
Chloride: 96 mEq/L (ref 96–112)
Creatinine, Ser: 0.65 mg/dL (ref 0.40–1.20)
GFR: 80.31 mL/min (ref >60.00)
Glucose, Bld: 88 mg/dL (ref 70–99)
Potassium: 4.6 mEq/L (ref 3.5–5.1)
Sodium: 134 mEq/L — ABNORMAL LOW (ref 135–145)
Total Bilirubin: 0.8 mg/dL (ref 0.2–1.2)
Total Protein: 7.4 g/dL (ref 6.0–8.3)

## 2021-04-17 LAB — CBC
HCT: 37 % (ref 36.0–46.0)
Hemoglobin: 12.7 g/dL (ref 12.0–15.0)
MCHC: 34.3 g/dL (ref 30.0–36.0)
MCV: 91.4 fl (ref 78.0–100.0)
Platelets: 299 10*3/uL (ref 150.0–400.0)
RBC: 4.05 Mil/uL (ref 3.87–5.11)
RDW: 13.2 % (ref 11.5–15.5)
WBC: 5.8 10*3/uL (ref 4.0–10.5)

## 2021-04-17 LAB — TSH: TSH: 2.17 u[IU]/mL (ref 0.35–5.50)

## 2021-04-17 MED ORDER — CITRUCEL PO POWD: 1.0000 | Freq: Every day | ORAL | Status: DC

## 2021-04-17 NOTE — Progress Notes (Signed)
Review of pertinent gastrointestinal problems:   1. Family history for colon cancer (mother diagnosed in her late 52s), personal history of tubular adenomas. Last colonoscopy November 2007 by Dr. Lyla Son, tortuous although normal. Next colonoscopy November 2012.  Was scheduled for colonoscopy 2012 but she had difficulty with the prep (moviprep) and she canceled it.  10/2011 Colonoscopy Dr. Ardis Hughs with different prep; normal except small hemorrhoids.  Recommended no further CRC screening given age. 2. Chronic gastroesophageal reflux disease. Has been on a variety of anti-acid, on a variety of PPIs with mixed results. esophagogastroduodenoscopy January 2005 by Dr. Lyla Son. No esophagitis but a Schatzki's ring was seen. Hyperplastic polyps in the gastric body. EGD February 2008 by Dr. Ardis Hughs was normal except for previously sampled hyperplastic-appearing gastric polyps.  3. Chronic dyspepsia, somewhat GERD like.  Repeat EGD Dr. Ardis Hughs 12/2014 found mild gastritis, h. Pylori negative. Follow up CT scan 12/2014 was unrevealing.  Improved on omeprazole. 2017/2018 intermittently improves on variety of PPI trials.  Significant improvement on low-dose Klonopin twice daily.    HPI: This is a very pleasant 87 year old woman   I last saw [place] about 2 and half months ago here in the office.  She had a lot of mild, vague GI type symptoms including gassiness some fleeting constipation mild abdominal discomforts.  She was now losing weight, she was not having any severe or significant abdominal pain she was not having any overt GI bleeding.  I recommended she consider a trial of probiotic medicines and return to see me in 2 months.  Her weight is up 3 pounds since her last office visit here, same scale here in our office  She tried fiber supplements and she actually thinks it made things a bit worse.  She is bothered by intermittent constipation intermittent loose stools, intermittent abdominal discomforts.   Her stools can be mushy, sometimes they are hard to push out.  She does not see blood in her stool.  She is eating quite well.  Her current bowel routine is that she has a half a cup of prune juice, several raisins and several grapes and a half dose of MiraLAX every day.  ROS: complete GI ROS as described in HPI, all other review negative.  Constitutional:  No unintentional weight loss   Past Medical History:  Diagnosis Date   Arthritis    Bladder infection    Cancer, skin, squamous cell    Colon polyps    Frozen shoulder    left    Gallstones    GERD (gastroesophageal reflux disease)    Headache syndrome 12/22/2019   Hiatal hernia    IBS (irritable bowel syndrome)    Osteopenia    Restless leg syndrome     Past Surgical History:  Procedure Laterality Date   APPENDECTOMY     CATARACT EXTRACTION Bilateral    April and May 2021   CESAREAN SECTION     CHOLECYSTECTOMY     MOHS SURGERY Left    scalp- SCC    Current Outpatient Medications  Medication Sig Dispense Refill   amLODipine (NORVASC) 2.5 MG tablet TAKE 1 TABLET BY MOUTH EVERY DAY     Calcium-Vitamin D-Vitamin K 500-100-40 MG-UNT-MCG CHEW Chew 2 each by mouth daily.       clonazePAM (KLONOPIN) 0.25 MG disintegrating tablet Take 1 tablet (0.25 mg total) by mouth 2 (two) times daily. 60 tablet 0   DULoxetine (CYMBALTA) 30 MG capsule Take 1 capsule (30 mg total) by mouth daily.  30 capsule 3   famotidine (PEPCID) 40 MG tablet TAKE 1 TABLET BY MOUTH EVERYDAY AT BEDTIME 90 tablet 1   pantoprazole (PROTONIX) 40 MG tablet Take 1 tablet (40 mg total) by mouth 2 (two) times daily. Take 1 tablet 30 minutes before breakfast and dinner 180 tablet 0   No current facility-administered medications for this visit.    Allergies as of 04/17/2021 - Review Complete 04/17/2021  Allergen Reaction Noted   Nitroglycerin Other (See Comments) 11/16/2015   Sucralfate Other (See Comments) 05/22/2017    Family History  Problem Relation Age  of Onset   Colon cancer Mother 20   Heart disease Father        MI   Heart disease Brother    Lung cancer Brother        x3   Diabetes Brother    Hypertension Brother    Bone cancer Sister    Lung cancer Sister        remission   Throat cancer Brother    Diabetes Maternal Grandmother    Colon polyps Neg Hx    Liver disease Neg Hx    Kidney disease Neg Hx     Social History   Socioeconomic History   Marital status: Married    Spouse name: Not on file   Number of children: 2   Years of education: Not on file   Highest education level: Not on file  Occupational History   Occupation: retired  Tobacco Use   Smoking status: Never   Smokeless tobacco: Never  Vaping Use   Vaping Use: Never used  Substance and Sexual Activity   Alcohol use: Not Currently    Alcohol/week: 0.0 standard drinks    Comment: occasionally wine   Drug use: No   Sexual activity: Not on file  Other Topics Concern   Not on file  Social History Narrative   Not on file   Social Determinants of Health   Financial Resource Strain: Not on file  Food Insecurity: Not on file  Transportation Needs: Not on file  Physical Activity: Not on file  Stress: Not on file  Social Connections: Not on file  Intimate Partner Violence: Not on file     Physical Exam: Ht 5' 3.5" (1.613 m)   Wt 124 lb (56.2 kg)   BMI 21.62 kg/m  Constitutional: generally well-appearing Psychiatric: alert and oriented x3 Abdomen: soft, nontender, nondistended, no obvious ascites, no peritoneal signs, normal bowel sounds No peripheral edema noted in lower extremities  Assessment and plan: 87 y.o. female with alternating constipation, loose stools, mild abdominal discomforts  I offered her colonoscopy to evaluate this further.  Her mother indeed was diagnosed with colon cancer in her early 27s.  She is not interested in that however.  We did agree to get some basic lab work including a CBC, comprehensive metabolic profile,  thyroid TSH.  She will also start a fiber supplements Citrucel on a daily basis and stop the other bowel routine.  She will follow-up with me in 2 months and sooner if needed.  Please see the "Patient Instructions" section for addition details about the plan.  Owens Loffler, MD [facility] 04/17/2021, 8:53 AM   Total time on date of encounter was 35 minutes (this included time spent preparing to see the patient reviewing records; obtaining and/or reviewing separately obtained history; performing a medically appropriate exam and/or evaluation; counseling and educating the patient and family if present; ordering medications, tests or procedures if applicable;  and documenting clinical information in the health record).

## 2021-04-17 NOTE — Patient Instructions (Addendum)
If you are age 85 or older, your body mass index should be between 23-30. Your Body mass index is 21.62 kg/m. If this is out of the aforementioned range listed, please consider follow up with your Primary Care Provider. ________________________________________________________  The [facility] providers would like to encourage you to use [facility] to communicate with providers for non-urgent requests or questions.  Due to long hold times on the telephone, sending your provider a message by [facility] may be a faster and more efficient way to get a response.  Please allow 48 business hours for a response.  Please remember that this is for non-urgent requests.  _______________________________________________________  Your provider has requested that you go to the basement level for lab work before leaving today. Press "B" on the elevator. The lab is located at the first door on the left as you exit the elevator.  Due to recent changes in healthcare laws, you may see the results of your imaging and laboratory studies on MyChart before your provider has had a chance to review them.  We understand that in some cases there may be results that are confusing or concerning to you. Not all laboratory results come back in the same time frame and the provider may be waiting for multiple results in order to interpret others.  Please give [place] 48 hours in order for your provider to thoroughly review all the results before contacting the office for clarification of your results.   Please start taking citrucel (orange flavored) powder fiber supplement.  This may cause some bloating at first but that usually goes away. Begin with a small spoonful and work your way up to a large, heaping spoonful daily over a week.  Thank you for entrusting me with your care and choosing [facility].  Dr Ardis Hughs

## 2021-05-16 ENCOUNTER — Other Ambulatory Visit: Payer: Self-pay

## 2021-05-16 ENCOUNTER — Ambulatory Visit (INDEPENDENT_AMBULATORY_CARE_PROVIDER_SITE_OTHER): Payer: Medicare HMO

## 2021-05-16 ENCOUNTER — Encounter: Payer: Self-pay | Admitting: Orthopedic Surgery

## 2021-05-16 ENCOUNTER — Ambulatory Visit: Payer: Medicare HMO | Admitting: Orthopedic Surgery

## 2021-05-16 ENCOUNTER — Ambulatory Visit: Payer: Self-pay

## 2021-05-16 DIAGNOSIS — M25512 Pain in left shoulder: Secondary | ICD-10-CM | POA: Diagnosis not present

## 2021-05-16 DIAGNOSIS — M65811 Other synovitis and tenosynovitis, right shoulder: Secondary | ICD-10-CM

## 2021-05-16 DIAGNOSIS — M25511 Pain in right shoulder: Secondary | ICD-10-CM | POA: Diagnosis not present

## 2021-05-16 MED ORDER — BUPIVACAINE HCL 0.5 % IJ SOLN
9.0000 mL | INTRAMUSCULAR | Status: AC | PRN
  Administered 2021-05-16: 9 mL via INTRA_ARTICULAR

## 2021-05-16 MED ORDER — LIDOCAINE HCL 1 % IJ SOLN
5.0000 mL | INTRAMUSCULAR | Status: AC | PRN
  Administered 2021-05-16: 5 mL

## 2021-05-16 MED ORDER — METHYLPREDNISOLONE ACETATE 40 MG/ML IJ SUSP
40.0000 mg | INTRAMUSCULAR | Status: AC | PRN
Start: 2021-05-16 — End: 2021-05-16
  Administered 2021-05-16: 40 mg via INTRA_ARTICULAR

## 2021-05-16 NOTE — Progress Notes (Signed)
Office Visit Note   Patient: Madison [name]           Date of Birth: 1934/05/22           MRN: [id number] Visit Date: 05/16/2021 Requested by: Aletha Halim., PA-C [address]  [place] [address] PCP: Aletha Halim., PA-C  Subjective: Chief Complaint  Patient presents with   Left Shoulder - Pain   Right Shoulder - Pain    HPI: Madison [name] is an 87 year old patient with bilateral shoulder pain right worse than left.  Denies any history of injury.  Has cervical spine issues as well.  States at night she "hurts all over".  Does not really like to take medication.  Her right shoulder is giving her more symptoms than the left but she denies much in the way of a radicular component.              ROS: All systems reviewed are negative as they relate to the chief complaint within the history of present illness.  Patient denies  fevers or chills.   Assessment & Plan: Visit Diagnoses:  1. Bilateral shoulder pain, unspecified chronicity     Plan: Impression is right worse than left shoulder pain with left worse than right shoulder arthritis.  I think she does have a moderate amount of arthritis in that right shoulder.  Intra-articular glenohumeral joint injection performed today.  See if that helps her.  Left shoulder is less symptomatic but does have more arthritis radiographically.  No intervention required on the left shoulder.  We talked about taking anti-inflammatories but she does have history of reflux.  Extra strength Tylenol may be the best thing for her to try.  Follow-Up Instructions: No follow-ups on file.   Orders:  Orders Placed This Encounter  Procedures   XR Shoulder Right   XR Shoulder Left   No orders of the defined types were placed in this encounter.     Procedures: Large Joint Inj: R glenohumeral on 05/16/2021 2:26 PM Indications: diagnostic evaluation and pain Details: 18 G 1.5 in needle, posterior approach  Arthrogram: No  Medications: 9  mL bupivacaine 0.5 %; 40 mg methylPREDNISolone acetate 40 MG/ML; 5 mL lidocaine 1 % Outcome: tolerated well, no immediate complications Procedure, treatment alternatives, risks and benefits explained, specific risks discussed. Consent was given by the patient. Immediately prior to procedure a time out was called to verify the correct patient, procedure, equipment, support staff and site/side marked as required. Patient was prepped and draped in the usual sterile fashion.      Clinical Data: No additional findings.  Objective: Vital Signs: There were no vitals taken for this visit.  Physical Exam:   Constitutional: Patient appears well-developed HEENT:  Head: Normocephalic Eyes:EOM are normal Neck: Normal range of motion Cardiovascular: Normal rate Pulmonary/chest: Effort normal Neurologic: Patient is alert Skin: Skin is warm Psychiatric: Patient has normal mood and affect   Ortho Exam: Ortho exam demonstrates full active and passive range of motion of the cervical spine.  Both shoulders have good rotator cuff strength infraspinatus supraspinatus absent muscle testing.  On the right-hand side she has range of motion of 40/90/160.  Rotator cuff strength is intact.  Not too much coarse grinding or crepitus with internal and external rotation of that right arm.  Motor or sensory function to the hand is intact.  Specialty Comments:  No specialty comments available.  Imaging: XR Shoulder Left  Result Date: 05/16/2021 AP axillary outlet view  left shoulder reviewed.  Severe end-stage glenohumeral joint arthritis is present.  Loose body noted inferior to the humeral neck.  Well ossified.  No acute fracture.  Shoulder is located.  XR Shoulder Right  Result Date: 05/16/2021 AP axillary outlet radiographs right shoulder reviewed.  Mild to moderate arthritis is present in the glenohumeral joint with some AC joint arthritis as well.  There is narrowing of the acromiohumeral interval.  No  acute fracture.  Shoulder is located.  Visualized lung fields clear.    PMFS History: Patient Active Problem List   Diagnosis Date Noted   Headache syndrome 12/22/2019   Hoarseness 12/05/2017   Urinary tract infectious disease 04/21/2017   Anxiety 10/17/2015   Chronic rhinitis 10/17/2015   Fatigue 10/17/2015   CLAUDICATION 08/31/2010   CHEST PAIN 08/31/2010   Claudication ([place]) 08/31/2010   GERD 10/14/2007   Past Medical History:  Diagnosis Date   Arthritis    Bladder infection    Cancer, skin, squamous cell    Colon polyps    Frozen shoulder    left    Gallstones    GERD (gastroesophageal reflux disease)    Headache syndrome 12/22/2019   Hiatal hernia    IBS (irritable bowel syndrome)    Osteopenia    Restless leg syndrome     Family History  Problem Relation Age of Onset   Colon cancer Mother 38   Heart disease Father        MI   Heart disease Brother    Lung cancer Brother        x3   Diabetes Brother    Hypertension Brother    Bone cancer Sister    Lung cancer Sister        remission   Throat cancer Brother    Diabetes Maternal Grandmother    Colon polyps Neg Hx    Liver disease Neg Hx    Kidney disease Neg Hx     Past Surgical History:  Procedure Laterality Date   APPENDECTOMY     CATARACT EXTRACTION Bilateral    April and May 2021   CESAREAN SECTION     CHOLECYSTECTOMY     MOHS SURGERY Left    scalp- SCC   Social History   Occupational History   Occupation: retired  Tobacco Use   Smoking status: Never   Smokeless tobacco: Never  Vaping Use   Vaping Use: Never used  Substance and Sexual Activity   Alcohol use: Not Currently    Alcohol/week: 0.0 standard drinks    Comment: occasionally wine   Drug use: No   Sexual activity: Not on file

## 2021-06-04 ENCOUNTER — Telehealth: Payer: Self-pay | Admitting: Gastroenterology

## 2021-06-04 NOTE — Telephone Encounter (Signed)
The pt states she has continued IBS symptoms (mushy stools) She is not taking the citrucel as ordered at her last office visit on 11/22.  I have advised her to take the citrucel as recommended and keep her follow up appt as planned on 06/22/21 with Dr Ardis Hughs. She says that when she did take it back in November her symptoms got better.  She will do a healing spoonful and keep appt as planned and call back if she worsens

## 2021-06-04 NOTE — Telephone Encounter (Signed)
Patient called states she is having issues with IBS seeking help said she is feeling weak.

## 2021-06-22 ENCOUNTER — Ambulatory Visit: Payer: Medicare HMO | Admitting: Gastroenterology

## 2021-06-22 ENCOUNTER — Encounter: Payer: Self-pay | Admitting: Gastroenterology

## 2021-06-22 VITALS — BP 130/62 | HR 80 | Ht 63.5 in | Wt 123.0 lb

## 2021-06-22 DIAGNOSIS — K589 Irritable bowel syndrome without diarrhea: Secondary | ICD-10-CM

## 2021-06-22 NOTE — Progress Notes (Signed)
Review of pertinent gastrointestinal problems:   1. Family history for colon cancer (mother diagnosed in her late 34s), personal history of tubular adenomas. Last colonoscopy November 2007 by Dr. Lyla Son, tortuous although normal. Next colonoscopy November 2012.  Was scheduled for colonoscopy 2012 but she had difficulty with the prep (moviprep) and she canceled it.  10/2011 Colonoscopy Dr. Ardis Hughs with different prep; normal except small hemorrhoids.  Recommended no further CRC screening given age. 2. Chronic gastroesophageal reflux disease. Has been on a variety of anti-acid, on a variety of PPIs with mixed results. esophagogastroduodenoscopy January 2005 by Dr. Lyla Son. No esophagitis but a Schatzki's ring was seen. Hyperplastic polyps in the gastric body. EGD February 2008 by Dr. Ardis Hughs was normal except for previously sampled hyperplastic-appearing gastric polyps.  3. Chronic dyspepsia, somewhat GERD like.  Repeat EGD Dr. Ardis Hughs 12/2014 found mild gastritis, h. Pylori negative. Follow up CT scan 12/2014 was unrevealing.  Improved on omeprazole. 2017/2018 intermittently improves on variety of PPI trials.  Significant improvement on low-dose Klonopin twice daily.   HPI: This is a very pleasant 88 year old woman   I last saw [place] about 2 months ago here in the office.  She was having alternating constipation and loose stools with some mild abdominal discomforts.  I offered a colonoscopy but she was not interested in that.  Instead she had blood work and I recommended she start Citrucel on a daily basis and stop all her other bowel routines.  Labs, November 2022 including CBC, complete metabolic profile and TSH were all essentially normal.  Her weight is down 1 pound since her last office visit here 2 months ago.  She started taking the Citrucel every morning and felt that it was not really improving her intermittent constipation and diarrhea and so she switched to p.m. dosing and seems to have a  better result that way.  It is incomplete however.  She is still worried about going out throughout the day socially for fear of having to run to the bathroom.  No overt bleeding.  Her weight is overall stable.  She is having a lot of troubles with insomnia and asked my opinion about her Klonopin dosing and whether she could take 1-1/2 pills at bedtime instead of 1 pill at bedtime and a half a pill in the morning.  ROS: complete GI ROS as described in HPI, all other review negative.  Constitutional:  No unintentional weight loss   Past Medical History:  Diagnosis Date   Arthritis    Bladder infection    Cancer, skin, squamous cell    Colon polyps    Frozen shoulder    left    Gallstones    GERD (gastroesophageal reflux disease)    Headache syndrome 12/22/2019   Hiatal hernia    IBS (irritable bowel syndrome)    Osteopenia    Restless leg syndrome     Past Surgical History:  Procedure Laterality Date   APPENDECTOMY     CATARACT EXTRACTION Bilateral    April and May 2021   CESAREAN SECTION     CHOLECYSTECTOMY     MOHS SURGERY Left    scalp- SCC    Current Outpatient Medications  Medication Sig Dispense Refill   amLODipine (NORVASC) 2.5 MG tablet TAKE 1 TABLET BY MOUTH EVERY DAY     Calcium-Vitamin D-Vitamin K 500-100-40 MG-UNT-MCG CHEW Chew 2 each by mouth daily.       clonazePAM (KLONOPIN) 0.25 MG disintegrating tablet Take 1 tablet (0.25 mg  total) by mouth 2 (two) times daily. 60 tablet 0   DULoxetine (CYMBALTA) 30 MG capsule Take 1 capsule (30 mg total) by mouth daily. 30 capsule 3   famotidine (PEPCID) 40 MG tablet TAKE 1 TABLET BY MOUTH EVERYDAY AT BEDTIME 90 tablet 1   methylcellulose (CITRUCEL) oral powder Take 1 packet by mouth daily.     pantoprazole (PROTONIX) 40 MG tablet Take 1 tablet (40 mg total) by mouth 2 (two) times daily. Take 1 tablet 30 minutes before breakfast and dinner 180 tablet 0   No current facility-administered medications for this visit.     Allergies as of 06/22/2021 - Review Complete 06/22/2021  Allergen Reaction Noted   Nitroglycerin Other (See Comments) 11/16/2015   Sucralfate Other (See Comments) 05/22/2017    Family History  Problem Relation Age of Onset   Colon cancer Mother 8   Heart disease Father        MI   Heart disease Brother    Lung cancer Brother        x3   Diabetes Brother    Hypertension Brother    Bone cancer Sister    Lung cancer Sister        remission   Throat cancer Brother    Diabetes Maternal Grandmother    Colon polyps Neg Hx    Liver disease Neg Hx    Kidney disease Neg Hx     Social History   Socioeconomic History   Marital status: Married    Spouse name: Not on file   Number of children: 2   Years of education: Not on file   Highest education level: Not on file  Occupational History   Occupation: retired  Tobacco Use   Smoking status: Never   Smokeless tobacco: Never  Vaping Use   Vaping Use: Never used  Substance and Sexual Activity   Alcohol use: Not Currently    Alcohol/week: 0.0 standard drinks    Comment: occasionally wine   Drug use: No   Sexual activity: Not on file  Other Topics Concern   Not on file  Social History Narrative   Not on file   Social Determinants of Health   Financial Resource Strain: Not on file  Food Insecurity: Not on file  Transportation Needs: Not on file  Physical Activity: Not on file  Stress: Not on file  Social Connections: Not on file  Intimate Partner Violence: Not on file     Physical Exam: Ht 5' 3.5" (1.613 m)    Wt 123 lb (55.8 kg)    BMI 21.45 kg/m  Constitutional: generally well-appearing Psychiatric: alert and oriented x3 Abdomen: soft, nontender, nondistended, no obvious ascites, no peritoneal signs, normal bowel sounds No peripheral edema noted in lower extremities  Assessment and plan: 88 y.o. female with alternating IBS, insomnia  I recommended that she continue taking the Citrucel on an every day  basis, seems that p.m. dosing is best for her.  I also recommended she add a single dose of MiraLAX at the same time every night.  I am hoping that she will be able to move her bowels in the morning and will be less fearful about going out of her house shopping, eating with friends.  She is really struggling with insomnia and is getting very little sleep.  She has my opinion about her Klonopin dose and I thought it would be safe for her to take 1-1/2 pills at bedtime instead of 1 pill at bedtime and a  half a pill in the morning.  She is going to adjust her dosing.  She will return to see me in 3 months and sooner if needed.  Please see the "Patient Instructions" section for addition details about the plan.  Owens Loffler, MD [facility] 06/22/2021, 10:54 AM   Total time on date of encounter was 25 minutes (this included time spent preparing to see the patient reviewing records; obtaining and/or reviewing separately obtained history; performing a medically appropriate exam and/or evaluation; counseling and educating the patient and family if present; ordering medications, tests or procedures if applicable; and documenting clinical information in the health record).

## 2021-06-22 NOTE — Patient Instructions (Addendum)
If you are age 86 or older, your body mass index should be between 23-30. Your Body mass index is 21.45 kg/m. If this is out of the aforementioned range listed, please consider follow up with your Primary Care Provider. _______________________________________________________  The [facility] providers would like to encourage you to use [facility] to communicate with providers for non-urgent requests or questions.  Due to long hold times on the telephone, sending your provider a message by [facility] may be a faster and more efficient way to get a response.  Please allow 48 business hours for a response.  Please remember that this is for non-urgent requests.  _______________________________________________________  Please take 1 tablespoon of Citrucel daily at bedtime.  Please take Miralax daily at bedtime.  Please fill to the line on the cap of the bottle.  You can adjust your dose of Klonopin as discussed.  You will need to follow up in our office in 3 months (April 2023).  We will contact you to schedule this appointment.  Thank you for entrusting me with your care and choosing [facility].  Dr Ardis Hughs

## 2021-07-24 ENCOUNTER — Other Ambulatory Visit: Payer: Self-pay | Admitting: Gastroenterology

## 2021-07-25 ENCOUNTER — Telehealth: Payer: Self-pay | Admitting: Gastroenterology

## 2021-07-25 NOTE — Telephone Encounter (Signed)
Inbound call from patient states she needs another treatment plan of medications because current plan is not working

## 2021-07-25 NOTE — Telephone Encounter (Signed)
The pt was seen on 1/6 with complaints of gas and constipation/diarrhea.  She started taking Miralax 1 dose every night with 1 tablespoon of citrucel mixed in with the miralax.   She states she has a BM every morning but she continues to have gas and cramps. She is still not able to enjoy time out of the house.  She is taking Klonopin 1 at night.  She tried to take 1 1/2 but the extra 1/2 made her feel jittery.  She would like to know what else she can do.  Please advise

## 2021-07-27 NOTE — Telephone Encounter (Signed)
The pt has been advised and will cut her citrucel dose in half.  Follow has been scheduled for 08/08/21 at 850 am.  The pt has been advised of the information and verbalized understanding.

## 2021-08-08 ENCOUNTER — Other Ambulatory Visit (INDEPENDENT_AMBULATORY_CARE_PROVIDER_SITE_OTHER): Payer: Medicare HMO

## 2021-08-08 ENCOUNTER — Ambulatory Visit: Payer: Medicare HMO | Admitting: Gastroenterology

## 2021-08-08 ENCOUNTER — Encounter: Payer: Self-pay | Admitting: Gastroenterology

## 2021-08-08 DIAGNOSIS — R1031 Right lower quadrant pain: Secondary | ICD-10-CM | POA: Diagnosis not present

## 2021-08-08 DIAGNOSIS — K5900 Constipation, unspecified: Secondary | ICD-10-CM

## 2021-08-08 LAB — BASIC METABOLIC PANEL
BUN: 11 mg/dL (ref 6–23)
CO2: 33 mEq/L — ABNORMAL HIGH (ref 19–32)
Calcium: 9.5 mg/dL (ref 8.4–10.5)
Chloride: 96 mEq/L (ref 96–112)
Creatinine, Ser: 0.62 mg/dL (ref 0.40–1.20)
GFR: 81.06 mL/min (ref >60.00)
Glucose, Bld: 95 mg/dL (ref 70–99)
Potassium: 4.1 mEq/L (ref 3.5–5.1)
Sodium: 133 mEq/L — ABNORMAL LOW (ref 135–145)

## 2021-08-08 MED ORDER — LINACLOTIDE 72 MCG PO CAPS: 72.0000 ug | ORAL_CAPSULE | Freq: Every day | ORAL | 6 refills | Status: DC

## 2021-08-08 MED ORDER — POLYETHYLENE GLYCOL 3350 17 G PO PACK: 17.0000 g | PACK | Freq: Every day | ORAL | 0 refills | Status: AC

## 2021-08-08 NOTE — Patient Instructions (Signed)
If you are age 88 or older, your body mass index should be between 23-30. Your Body mass index is 21.16 kg/m. If this is out of the aforementioned range listed, please consider follow up with your Primary Care Provider. ________________________________________________________  The [place] providers would like to encourage you to use [facility] to communicate with providers for non-urgent requests or questions.  Due to long hold times on the telephone, sending your provider a message by [facility] may be a faster and more efficient way to get a response.  Please allow 48 business hours for a response.  Please remember that this is for non-urgent requests.  _______________________________________________________  Your provider has requested that you go to the basement level for lab work before leaving today. Press "B" on the elevator. The lab is located at the first door on the left as you exit the elevator.  DISCONTINUE: Citrucel  Please purchase the following medications over the counter and take as directed:  CONTINUE: Miralax daily  We have sent the following medications to your pharmacy for you to pick up at your convenience:  START: Linzess 42mg one tablet daily.  [facility]. You are scheduled on 08-21-21  at 10:30am. You should arrive 15 minutes prior to your appointment time for registration.  [facility] bottles [facility] to your scan. The solution may taste better if refrigerated, but do NOT add ice or any other liquid to this solution. Shake well before drinking.   Please follow the written instructions below on the day of your exam:   1) Do not eat anything after 6:30am (4 hours prior to your test)   2) Drink 1 bottle of contrast @ 8:30am (2 hours prior to your exam)  Remember to shake well before drinking and do NOT pour over ice.     Drink 1  bottle of contrast @ 9:30am (1 hour prior to your exam)   You may take any medications as prescribed with a small amount of water, if necessary. If you take any of the following medications: METFORMIN, GLUCOPHAGE, GLUCOVANCE, AVANDAMET, RIOMET, FORTAMET, AR[place]MET, JANUMET, GLUMETZA or METAGLIP, you MAY be asked to HOLD this medication 48 hours AFTER the exam.   The purpose of you drinking the oral contrast is to aid in the visualization of your intestinal tract. The contrast solution may cause some diarrhea. Depending on your individual set of symptoms, you may also receive an intravenous injection of x-ray contrast/dye. [facility], depending on the type of exam you are having performed.   If you have any questions regarding your exam or if you need to reschedule, you may call [facility] at [phone] the hours of 8:00 am and 5:00 pm, Monday-Friday.   You are scheduled to follow up in our office on 10-10-21 at 9:10am  Due to recent changes in healthcare laws, you may see the results of your imaging and laboratory studies on MyChart before your provider has had a chance to review them.  We understand that in some cases there may be results that are confusing or concerning to you. Not all laboratory results come back in the same time frame and the provider may be waiting for multiple results in order to interpret others.  Please give [id number] hours in order  for your provider to thoroughly review all the results before contacting the office for clarification of your results.   Thank you for entrusting me with your care and choosing [facility]  Dr Ardis Hughs

## 2021-08-08 NOTE — Progress Notes (Signed)
Review of pertinent gastrointestinal problems:   1. Family history for colon cancer (mother diagnosed in her late 64s), personal history of tubular adenomas. Last colonoscopy November 2007 by Dr. Lyla Son, tortuous although normal. Next colonoscopy November 2012.  Was scheduled for colonoscopy 2012 but she had difficulty with the prep (moviprep) and she canceled it.  10/2011 Colonoscopy Dr. Ardis Hughs with different prep; normal except small hemorrhoids.  Recommended no further CRC screening given age. 2. Chronic gastroesophageal reflux disease. Has been on a variety of anti-acid, on a variety of PPIs with mixed results. esophagogastroduodenoscopy January 2005 by Dr. Lyla Son. No esophagitis but a Schatzki's ring was seen. Hyperplastic polyps in the gastric body. EGD February 2008 by Dr. Ardis Hughs was normal except for previously sampled hyperplastic-appearing gastric polyps.  3. Chronic dyspepsia, somewhat GERD like.  Repeat EGD Dr. Ardis Hughs 12/2014 found mild gastritis, h. Pylori negative. Follow up CT scan 12/2014 was unrevealing.  Improved on omeprazole. 2017/2018 intermittently improves on variety of PPI trials.  Significant improvement on low-dose Klonopin twice daily   HPI: This is a very pleasant 88 year old woman   I last saw her about 6 weeks ago.Marland Kitchen  She was having alternating IBS and insomnia issues.  I recommended that she continue taking Citrucel on an every day basis, probably at night.  I recommended adding a single dose of MiraLAX at the same time every night.  I also helped her adjust her Klonopin dosing.  I did not write her prescription for it however.  Blood work January 2023 showed normal CBC, normal complete metabolic profile except sodium 134  Her weight is down 2 pounds since her office visit 6 weeks ago.  She is still having difficulty with her bowels.  She is also having a lot of gassiness which is worse when she takes a full dose of Citrucel.  She is having right-sided lower  abdominal pains.  These wraparound to her back.  She is still having issues with insomnia.  She sees no blood in her stool.   ROS: complete GI ROS as described in HPI, all other review negative.  Constitutional:  No unintentional weight loss   Past Medical History:  Diagnosis Date   Arthritis    Bladder infection    Cancer, skin, squamous cell    Colon polyps    Frozen shoulder    left    Gallstones    GERD (gastroesophageal reflux disease)    Headache syndrome 12/22/2019   Hiatal hernia    IBS (irritable bowel syndrome)    Osteopenia    Restless leg syndrome     Past Surgical History:  Procedure Laterality Date   APPENDECTOMY     CATARACT EXTRACTION Bilateral    April and May 2021   CESAREAN SECTION     CHOLECYSTECTOMY     MOHS SURGERY Left    scalp- SCC    Current Outpatient Medications  Medication Sig Dispense Refill   amLODipine (NORVASC) 2.5 MG tablet TAKE 1 TABLET BY MOUTH EVERY DAY     Calcium-Vitamin D-Vitamin K 500-100-40 MG-UNT-MCG CHEW Chew 2 each by mouth daily.       clonazePAM (KLONOPIN) 0.25 MG disintegrating tablet Take 1 tablet (0.25 mg total) by mouth 2 (two) times daily. 60 tablet 0   DULoxetine (CYMBALTA) 30 MG capsule Take 1 capsule (30 mg total) by mouth daily. 30 capsule 3   famotidine (PEPCID) 40 MG tablet TAKE 1 TABLET BY MOUTH EVERYDAY AT BEDTIME 90 tablet 1   methylcellulose (  CITRUCEL) oral powder Take 1 packet by mouth daily.     pantoprazole (PROTONIX) 40 MG tablet TAKE 1 TABLET BY MOUTH TWICE A DAY 30 MINUTES BEFORE BREAKFAST AND DINNER 180 tablet 1   No current facility-administered medications for this visit.    Allergies as of 08/08/2021 - Review Complete 08/08/2021  Allergen Reaction Noted   Nitroglycerin Other (See Comments) 11/16/2015   Sucralfate Other (See Comments) 05/22/2017    Family History  Problem Relation Age of Onset   Colon cancer Mother 5   Heart disease Father        MI   Heart disease Brother    Lung  cancer Brother        x3   Diabetes Brother    Hypertension Brother    Bone cancer Sister    Lung cancer Sister        remission   Throat cancer Brother    Diabetes Maternal Grandmother    Colon polyps Neg Hx    Liver disease Neg Hx    Kidney disease Neg Hx     Social History   Socioeconomic History   Marital status: Married    Spouse name: Not on file   Number of children: 2   Years of education: Not on file   Highest education level: Not on file  Occupational History   Occupation: retired  Tobacco Use   Smoking status: Never   Smokeless tobacco: Never  Vaping Use   Vaping Use: Never used  Substance and Sexual Activity   Alcohol use: Not Currently    Alcohol/week: 0.0 standard drinks    Comment: occasionally wine   Drug use: No   Sexual activity: Not on file  Other Topics Concern   Not on file  Social History Narrative   Not on file   Social Determinants of Health   Financial Resource Strain: Not on file  Food Insecurity: Not on file  Transportation Needs: Not on file  Physical Activity: Not on file  Stress: Not on file  Social Connections: Not on file  Intimate Partner Violence: Not on file     Physical Exam: BP 100/70    Pulse 82    Ht 5' 3.5" (1.613 m)    Wt 121 lb 6 oz (55.1 kg)    BMI 21.16 kg/m  Constitutional: Frail, elderly Psychiatric: alert and oriented x3 Abdomen: soft, nontender, nondistended, no obvious ascites, no peritoneal signs, normal bowel sounds No peripheral edema noted in lower extremities  Assessment and plan: 88 y.o. female with alternating IBS, predominantly constipation, right lower quadrant discomforts  Gassiness is a significant issue for her when she is on Citrucel and so I am asking her to completely stop it.  She will remain on MiraLAX 1 dose once daily.  I am starting her on Linzess 72 mcg pills 1 pill once daily to help with her chronic alternating IBS, most often constipation IBS.  I am going to get a CT scan of her  abdomen pelvis for her right-sided lower abdominal pains that wraparound to her back.  I doubt these are anything serious.  She will return to see me in 2 months and sooner if needed.  Please see the "Patient Instructions" section for addition details about the plan.  Owens Loffler, MD [facility] 08/08/2021, 9:13 AM   Total time on date of encounter was 30 minutes (this included time spent preparing to see the patient reviewing records; obtaining and/or reviewing separately obtained history; performing a  medically appropriate exam and/or evaluation; counseling and educating the patient and family if present; ordering medications, tests or procedures if applicable; and documenting clinical information in the health record).

## 2021-08-10 ENCOUNTER — Other Ambulatory Visit: Payer: Self-pay | Admitting: Gastroenterology

## 2021-08-21 ENCOUNTER — Other Ambulatory Visit: Payer: Self-pay

## 2021-08-21 ENCOUNTER — Encounter (HOSPITAL_COMMUNITY): Payer: Self-pay

## 2021-08-21 ENCOUNTER — Ambulatory Visit (HOSPITAL_COMMUNITY)
Admission: RE | Admit: 2021-08-21 | Discharge: 2021-08-21 | Disposition: A | Discharge status: Home / Self Care | Payer: Medicare HMO | Source: Ambulatory Visit | Attending: Gastroenterology | Admitting: Gastroenterology

## 2021-08-21 DIAGNOSIS — R1031 Right lower quadrant pain: Secondary | ICD-10-CM | POA: Diagnosis not present

## 2021-08-21 MED ORDER — IOHEXOL 300 MG/ML  SOLN
100.0000 mL | Freq: Once | INTRAMUSCULAR | Status: AC | PRN
  Administered 2021-08-21: 100 mL via INTRAVENOUS

## 2021-08-21 MED ORDER — SODIUM CHLORIDE (PF) 0.9 % IJ SOLN
INTRAMUSCULAR | Status: AC
  Filled 2021-08-21: qty 50

## 2021-08-23 ENCOUNTER — Telehealth: Payer: Self-pay | Admitting: Gastroenterology

## 2021-08-23 NOTE — Telephone Encounter (Signed)
Spoke with patient regarding CT results. She is aware that Dr. Ardis Hughs still needs to review before we can let her know anything. ?

## 2021-08-23 NOTE — Telephone Encounter (Signed)
Patient requesting results from CT done on 3/7. Please advise. ?

## 2021-10-10 ENCOUNTER — Ambulatory Visit: Payer: Medicare HMO | Admitting: Gastroenterology

## 2021-10-10 ENCOUNTER — Encounter: Payer: Self-pay | Admitting: Gastroenterology

## 2021-10-10 VITALS — BP 120/58 | HR 80 | Ht 63.5 in | Wt 120.8 lb

## 2021-10-10 DIAGNOSIS — K5900 Constipation, unspecified: Secondary | ICD-10-CM

## 2021-10-10 NOTE — Progress Notes (Signed)
Review of pertinent gastrointestinal problems:   ?1. Family history for colon cancer (mother diagnosed in her late 63s), personal history of tubular adenomas. Last colonoscopy November 2007 by Dr. Lyla Son, tortuous although normal. Next colonoscopy November 2012.  Was scheduled for colonoscopy 2012 but she had difficulty with the prep (moviprep) and she canceled it.  10/2011 Colonoscopy Dr. Ardis Hughs with different prep; normal except small hemorrhoids.  Recommended no further CRC screening given age. ?2. Chronic gastroesophageal reflux disease. Has been on a variety of anti-acid, on a variety of PPIs with mixed results. esophagogastroduodenoscopy January 2005 by Dr. Lyla Son. No esophagitis but a Schatzki's ring was seen. Hyperplastic polyps in the gastric body. EGD February 2008 by Dr. Ardis Hughs was normal except for previously sampled hyperplastic-appearing gastric polyps.  ?3. Chronic dyspepsia, somewhat GERD like.  Repeat EGD Dr. Ardis Hughs 12/2014 found mild gastritis, h. Pylori negative. Follow up CT scan 12/2014 was unrevealing.  Improved on omeprazole. 2017/2018 intermittently improves on variety of PPI trials.  Significant improvement on low-dose Klonopin twice daily ?4.  Change in bowel habits, relative constipation in early 2023.  Not anemic.  CT scan 07/2021 abdomen pelvis with IV and oral contrast unrevealing. ? ? ?HPI: ?This is a very pleasant 88 year old woman whom I last saw about 2 months ago ? ? ?I last saw her about 2 months ago here in the office.  She was having some difficulty with gassiness from the Uncertain and so I asked that she completely stop it and instead start taking MiraLAX for her constipation and I gave her a prescription for Linzess 72 mcg pills 1 pill once daily.  CT scan was underwhelming, see above. ? ?She is pretty content with her bowels lately.  She is taking MiraLAX 1 dose every morning and her coffee.  She is eating raisin bran and fruit.  She started magnesium for some restless  leg syndrome as well.  She tried Linzess and noticed really no difference.  She did not take it after the sample packs. ? ?She has had no bleeding.  She has no abdominal pains. ? ? ?ROS: complete GI ROS as described in HPI, all other review negative. ? ?Constitutional:  No unintentional weight loss ? ? ?Past Medical History:  ?Diagnosis Date  ? Arthritis   ? Bladder infection   ? Cancer, skin, squamous cell   ? Colon polyps   ? Frozen shoulder   ? left   ? Gallstones   ? GERD (gastroesophageal reflux disease)   ? Headache syndrome 12/22/2019  ? Hiatal hernia   ? IBS (irritable bowel syndrome)   ? Osteopenia   ? Restless leg syndrome   ? ? ?Past Surgical History:  ?Procedure Laterality Date  ? APPENDECTOMY    ? CATARACT EXTRACTION Bilateral   ? April and May 2021  ? CESAREAN SECTION    ? CHOLECYSTECTOMY    ? MOHS SURGERY Left   ? scalp- SCC  ? ? ?Current Outpatient Medications  ?Medication Instructions  ? amLODipine (NORVASC) 2.5 MG tablet TAKE 1 TABLET BY MOUTH EVERY DAY  ? Calcium-Vitamin D-Vitamin K 500-100-40 MG-UNT-MCG CHEW 2 each, Oral, Daily  ? clonazePAM (KLONOPIN) 0.25 mg, Oral, 2 times daily  ? DULoxetine (CYMBALTA) 30 mg, Oral, Daily  ? famotidine (PEPCID) 40 MG tablet TAKE 1 TABLET BY MOUTH EVERYDAY AT BEDTIME  ? magnesium gluconate (MAGONATE) 500 mg, Oral, 2 times daily  ? nitrofurantoin (macrocrystal-monohydrate) (MACROBID) 100 mg, Oral, 2 times daily  ? pantoprazole (PROTONIX) 40 MG  tablet TAKE 1 TABLET BY MOUTH TWICE A DAY 30 MINUTES BEFORE BREAKFAST AND DINNER  ? polyethylene glycol (MIRALAX) 17 g, Oral, Daily  ? ? ?Allergies as of 10/10/2021 - Review Complete 10/10/2021  ?Allergen Reaction Noted  ? Nitroglycerin Other (See Comments) 11/16/2015  ? Sucralfate Other (See Comments) 05/22/2017  ? ? ?Family History  ?Problem Relation Age of Onset  ? Colon cancer Mother 32  ? Heart disease Father   ?     MI  ? Heart disease Brother   ? Lung cancer Brother   ?     x3  ? Diabetes Brother   ? Hypertension  Brother   ? Bone cancer Sister   ? Lung cancer Sister   ?     remission  ? Throat cancer Brother   ? Diabetes Maternal Grandmother   ? Colon polyps Neg Hx   ? Liver disease Neg Hx   ? Kidney disease Neg Hx   ? ? ?Social History  ? ?Socioeconomic History  ? Marital status: Married  ?  Spouse name: Not on file  ? Number of children: 2  ? Years of education: Not on file  ? Highest education level: Not on file  ?Occupational History  ? Occupation: retired  ?Tobacco Use  ? Smoking status: Never  ? Smokeless tobacco: Never  ?Vaping Use  ? Vaping Use: Never used  ?Substance and Sexual Activity  ? Alcohol use: Not Currently  ?  Alcohol/week: 0.0 standard drinks  ?  Comment: occasionally wine  ? Drug use: No  ? Sexual activity: Not on file  ?Other Topics Concern  ? Not on file  ?Social History Narrative  ? Not on file  ? ?Social Determinants of Health  ? ?Financial Resource Strain: Not on file  ?Food Insecurity: Not on file  ?Transportation Needs: Not on file  ?Physical Activity: Not on file  ?Stress: Not on file  ?Social Connections: Not on file  ?Intimate Partner Violence: Not on file  ? ? ? ?Physical Exam: ?BP (!) 120/58   Pulse 80   Ht 5' 3.5" (1.613 m)   Wt 120 lb 12.8 oz (54.8 kg)   BMI 21.06 kg/m?  ?Constitutional: generally well-appearing ?Psychiatric: alert and oriented x3 ?Abdomen: soft, nontender, nondistended, no obvious ascites, no peritoneal signs, normal bowel sounds ?No peripheral edema noted in lower extremities ? ?Assessment and plan: ?88 y.o. female with change in bowels several months ago, family history of colon cancer ? ?She is pretty content with her bowels now on dietary changes and MiraLAX once daily.  We had a nice discussion about the fact that her mother had colon cancer in her 64s and she has not had colon cancer screening or colonoscopy in about 10 years.  She is not very interested in having a colonoscopy at this point and I told her I do not think she absolutely needs to have 1 given her  symptoms have improved with dietary measurements and CT scan abdomen pelvis was underwhelming.  She knows to call however if she has any further questions or concerns or if her bowels worsen ? ?Please see the "Patient Instructions" section for addition details about the plan. ? ?Owens Loffler, MD ?[facility] ?10/10/2021, 9:21 AM ? ? ?Total time on date of encounter was 30 minutes (this included time spent preparing to see the patient reviewing records; obtaining and/or reviewing separately obtained history; performing a medically appropriate exam and/or evaluation; counseling and educating the patient and family if present;  ordering medications, tests or procedures if applicable; and documenting clinical information in the health record). ? ?

## 2021-10-10 NOTE — Patient Instructions (Signed)
Follow up with [place] as needed.  ? ?The [place] [facility] providers would like to encourage you to use [facility] to communicate with providers for non-urgent requests or questions.  Due to long hold times on the telephone, sending your provider a message by [facility] may be a faster and more efficient way to get a response.  Please allow 48 business hours for a response.  Please remember that this is for non-urgent requests.  ? ? ?

## 2022-01-25 ENCOUNTER — Other Ambulatory Visit: Payer: Self-pay | Admitting: Gastroenterology

## 2022-04-18 ENCOUNTER — Other Ambulatory Visit (HOSPITAL_COMMUNITY): Payer: Self-pay | Admitting: Family Medicine

## 2022-04-18 ENCOUNTER — Ambulatory Visit (HOSPITAL_COMMUNITY)
Admission: RE | Admit: 2022-04-18 | Discharge: 2022-04-18 | Disposition: A | Discharge status: Home / Self Care | Payer: Medicare HMO | Source: Ambulatory Visit | Attending: Family Medicine | Admitting: Family Medicine

## 2022-04-18 DIAGNOSIS — R0602 Shortness of breath: Secondary | ICD-10-CM | POA: Diagnosis present

## 2022-07-09 NOTE — Progress Notes (Signed)
07/12/2022 Madison [name] [id number] [birth year]  Referring provider: Aletha Halim., PA-C Primary GI doctor: Dr. Lyndel Safe (Dr. Ardis Hughs)  ASSESSMENT AND PLAN:   Chronic idiopathic constipation with RLQ AB pain and slightly abnormal rectal exam possible fissure versus ulcer but non tender, can be from large appreciate hemorrhoids/irritation Recent normal CBC 01/04 without anemia or leukocytosis Discussed flex sig/colonoscopy, declines at this time Will treat with hydrocortisone, proceed with CT AB and pelvis schedule close follow up with Dr. Lyndel Safe Likely component of pelvic floor dysfunction, get squatty potty - linzess samples given 145 and 290 - instructions how to take given to patient in AVS - lifestyle changes discussed  -Still likely component of anxiety/IBS, discussed with patient, consider SSRI if not better  Gastroesophageal reflux disease, with some RUQ pain Normal LFTs Last CT with slightly irregular pancreatic head, will repeat CT to evaluate pancreatse/liver/GB Lifestyle changes discussed, avoid NSAIDS, ETOH Continue PPI Twice daily, pepcid twice daily No alarm symptoms, will not schedule for EGD at this time but if symptoms do not improve can consider next OV. Possible dysfunction component with her high anxiety, consider SSRI if not improving and every negative.    Patient Care Team: Aletha Halim., PA-C as PCP - General (Family Medicine) Jodi Marble, MD as Consulting Physician (Otolaryngology)  HISTORY OF PRESENT ILLNESS: 89 y.o. female with a past medical history of family history of colon cancer mother diagnosed in her 56s, personal history of tubular adenomas,, chronic reflux, chronic dyspepsia improved on omeprazole and significant improvement on low-dose Klonopin twice daily, status post cholecystectomy, history of Scc on scalp, and others listed below presents for evaluation of abdominal pain.   EGD February 2008 by Dr. Ardis Hughs was normal except  for previously sampled hyperplastic-appearing gastric polyps.   10/2011 Colonoscopy Dr. Ardis Hughs with different prep; normal except small hemorrhoids.  Recommended no further CRC screening given age.  02/07/2015 EGD unremarkable mild chronic gastritis negative H pylori  CT scan 08/2021 right lower abdomen pelvis with IV and oral contrast unrevealing, mild to moderate pancreatic atrophy with asymmetry involving pancreatic head, no ductal dilatation or signs of inflammation.  Status post cholecystectomy and appendectomy. 10/10/2021 office visit with Dr. Ardis Hughs, bowels improved with dietary changes and MiraLAX once daily.  Discussed possibility of colon cancer screening with family history of colon cancer with mother in the 13s, declined at that visit.  She states she is very anxious, klonopin at night which has helped IBS and sleep.  She use to be very regular for Bm's, she the started to have constipation.  She started on citracel but that caused gas.  She states she has not had a normal BM in a years.  Use to be mushy, but now small loose stools with straining or takes a long time.  She is prone to urinary incontinence, have vaginal dryness, no feeling of prolapse.   No AB pain, AB bloating.  She had one episode this week at night with small volume loose stools and urgency.  She denies hematochezia, has very small low dose blood on TP with straining only. She is on miralax capful in the morning, has been drinking prune juice.  She has been taking dulcolax as needed since Christmas.   She has history of gastritis, has been on protonix 40 mg twice a day with pepcid which was doing well until about Christmas.  She was having RUQ side pain and around to her back, this has been better.  No NSAIDS,  no ETOH.  No OTC medications.  No nausea no vomiting, no weight loss  Wt Readings from Last 3 Encounters:  07/12/22 121 lb (54.9 kg)  10/10/21 120 lb 12.8 oz (54.8 kg)  08/08/21 121 lb 6 oz (55.1 kg)      She  reports that she has never smoked. She has never used smokeless tobacco. She reports that she does not currently use alcohol. She reports that she does not use drugs.  Current Medications:    Current Outpatient Medications (Cardiovascular):    amLODipine (NORVASC) 2.5 MG tablet, TAKE 1 TABLET BY MOUTH EVERY DAY     Current Outpatient Medications (Other):    bisacodyl (DULCOLAX) 5 MG EC tablet, Take 5 mg by mouth daily as needed for moderate constipation.   Calcium-Vitamin D-Vitamin K 500-100-40 MG-UNT-MCG CHEW, Chew 2 each by mouth daily.     clonazePAM (KLONOPIN) 0.25 MG disintegrating tablet, Take 1 tablet (0.25 mg total) by mouth 2 (two) times daily.   famotidine (PEPCID) 40 MG tablet, TAKE 1 TABLET BY MOUTH EVERYDAY AT BEDTIME   magnesium gluconate (MAGONATE) 500 MG tablet, Take 500 mg by mouth 2 (two) times daily.   pantoprazole (PROTONIX) 40 MG tablet, TAKE 1 TABLET BY MOUTH TWICE A DAY 30 MINUTES BEFORE BREAKFAST AND DINNER   polyethylene glycol (MIRALAX) 17 g packet, Take 17 g by mouth daily.   DULoxetine (CYMBALTA) 30 MG capsule, Take 1 capsule (30 mg total) by mouth daily. (Patient not taking: Reported on 07/12/2022)   hydrocortisone (ANUSOL-HC) 2.5 % rectal cream, Place 1 Application rectally 2 (two) times daily.   hydrocortisone (ANUSOL-HC) 25 MG suppository, Place 1 suppository (25 mg total) rectally 2 (two) times daily.   nitrofurantoin, macrocrystal-monohydrate, (MACROBID) 100 MG capsule, Take 100 mg by mouth 2 (two) times daily. (Patient not taking: Reported on 07/12/2022)  Medical History:  Past Medical History:  Diagnosis Date   Arthritis    Bladder infection    Cancer, skin, squamous cell    Colon polyps    Frozen shoulder    left    Gallstones    GERD (gastroesophageal reflux disease)    Headache syndrome 12/22/2019   Hiatal hernia    IBS (irritable bowel syndrome)    Osteopenia    Restless leg syndrome    Allergies:  Allergies  Allergen  Reactions   Nitroglycerin Other (See Comments)    Passed out   Sucralfate Other (See Comments)    Patient states that it makes her stomach cramp.     Surgical History:  She  has a past surgical history that includes Cholecystectomy; Appendectomy; Cesarean section; Mohs surgery (Left); and Cataract extraction (Bilateral). Family History:  Her family history includes Bone cancer in her sister; Colon cancer (age of onset: 88) in her mother; Diabetes in her brother and maternal grandmother; Heart disease in her brother and father; Hypertension in her brother; Lung cancer in her brother and sister; Throat cancer in her brother.  REVIEW OF SYSTEMS  : All other systems reviewed and negative except where noted in the History of Present Illness.  PHYSICAL EXAM: BP 128/60   Pulse 90   Ht '5\' 4"'$  (1.626 m)   Wt 121 lb (54.9 kg)   SpO2 (!) 81%   BMI 20.77 kg/m  General:   Pleasant, well developed, younger than stated age, female in no acute distress Head:   Normocephalic and atraumatic. Hoarseness in throat Eyes:  sclerae anicteric,conjunctive pink  Heart:   regular rate and rhythm Pulm:  Clear anteriorly; no wheezing Abdomen:   Soft, Obese AB, Active bowel sounds. mild tenderness in the epigastrium and in the RLQ. Without guarding and Without rebound, No organomegaly appreciated. Rectal: Patient with anterior slight non tender ulceration/opening, initially slightly clear discharge, internal exam with internal hemorrhoids, soft brown stool, negative hemoccult Extremities:  Without edema. Msk: Symmetrical without gross deformities. Peripheral pulses intact.  Neurologic:  Alert and  oriented x4;  No focal deficits.  Skin:   Dry and intact without significant lesions or rashes. Psychiatric:  Cooperative. Normal mood and affect.  RELEVANT LABS AND IMAGING: CBC    Component Value Date/Time   WBC 5.8 04/17/2021 0913   RBC 4.05 04/17/2021 0913   HGB 12.7 04/17/2021 0913   HCT 37.0 04/17/2021  0913   PLT 299.0 04/17/2021 0913   MCV 91.4 04/17/2021 0913   MCH 31.7 04/24/2017 0950   MCHC 34.3 04/17/2021 0913   RDW 13.2 04/17/2021 0913   LYMPHSABS 1.5 02/02/2019 1427   MONOABS 0.5 02/02/2019 1427   EOSABS 0.0 02/02/2019 1427   BASOSABS 0.0 02/02/2019 1427    CMP     Component Value Date/Time   NA 133 (L) 08/08/2021 0949   NA 134 12/22/2019 0901   K 4.1 08/08/2021 0949   CL 96 08/08/2021 0949   CO2 33 (H) 08/08/2021 0949   GLUCOSE 95 08/08/2021 0949   BUN 11 08/08/2021 0949   BUN 9 12/22/2019 0901   CREATININE 0.62 08/08/2021 0949   CALCIUM 9.5 08/08/2021 0949   PROT 7.4 04/17/2021 0913   ALBUMIN 4.5 04/17/2021 0913   AST 16 04/17/2021 0913   ALT 13 04/17/2021 0913   ALKPHOS 60 04/17/2021 0913   BILITOT 0.8 04/17/2021 0913   GFRNONAA 76 12/22/2019 0901   GFRAA 87 12/22/2019 0901     Vladimir Crofts, PA-C 9:33 AM

## 2022-07-12 ENCOUNTER — Ambulatory Visit: Payer: Medicare HMO | Admitting: Physician Assistant

## 2022-07-12 ENCOUNTER — Telehealth: Payer: Self-pay | Admitting: Physician Assistant

## 2022-07-12 ENCOUNTER — Encounter: Payer: Self-pay | Admitting: Physician Assistant

## 2022-07-12 VITALS — BP 128/60 | HR 90 | Ht 64.0 in | Wt 121.0 lb

## 2022-07-12 DIAGNOSIS — K589 Irritable bowel syndrome without diarrhea: Secondary | ICD-10-CM | POA: Diagnosis not present

## 2022-07-12 DIAGNOSIS — K219 Gastro-esophageal reflux disease without esophagitis: Secondary | ICD-10-CM

## 2022-07-12 DIAGNOSIS — K626 Ulcer of anus and rectum: Secondary | ICD-10-CM

## 2022-07-12 DIAGNOSIS — R1011 Right upper quadrant pain: Secondary | ICD-10-CM

## 2022-07-12 DIAGNOSIS — K5904 Chronic idiopathic constipation: Secondary | ICD-10-CM

## 2022-07-12 DIAGNOSIS — R140 Abdominal distension (gaseous): Secondary | ICD-10-CM

## 2022-07-12 MED ORDER — HYDROCORTISONE (PERIANAL) 2.5 % EX CREA: 1.0000 | TOPICAL_CREAM | Freq: Two times a day (BID) | CUTANEOUS | 2 refills | Status: DC

## 2022-07-12 MED ORDER — HYDROCORTISONE ACETATE 25 MG RE SUPP: 25.0000 mg | Freq: Two times a day (BID) | RECTAL | 0 refills | Status: DC

## 2022-07-12 NOTE — Telephone Encounter (Signed)
Patient states she has a CT ordered. When she was called they stated she would have to arrive two hours prior and not drink any contrast. Patient is confused and would like nurse to call her to clarify. Patient would like to know if insurance will cover CT, Please advise.

## 2022-07-12 NOTE — Patient Instructions (Addendum)
You have been scheduled for a CT scan of the abdomen and pelvis at [facility] 1st floor Radiology. You are scheduled on _________________ at ______________. You should arrive 15 minutes prior to your appointment time for registration.    You may take any medications as prescribed with a small amount of water, if necessary. If you take any of the following medications: METFORMIN, GLUCOPHAGE, GLUCOVANCE, AVANDAMET, RIOMET, FORTAMET, [place] MET, JANUMET, GLUMETZA or METAGLIP, you MAY be asked to HOLD this medication 48 hours AFTER the exam.   The purpose of you drinking the oral contrast is to aid in the visualization of your intestinal tract. The contrast solution may cause some diarrhea. Depending on your individual set of symptoms, you may also receive an intravenous injection of x-ray contrast/dye. Plan on being at [facility] for 45 minutes or longer, depending on the type of exam you are having performed.   If you have any questions regarding your exam or if you need to reschedule, you may call [facility] at [phone] between the hours of 8:00 am and 5:00 pm, Monday-Friday.    Linzess 145 mcg and can go up to the 290 mcg *IBS-C patients may begin to experience relief from belly pain and overall abdominal symptoms (pain, discomfort, and bloating) in about 1 week,  with symptoms typically improving over 12 weeks.  Take at least 30 minutes before the first meal of the day on an empty stomach You can have a loose stool if you eat a high-fat breakfast. Give it at least 7 days, may have more bowel movements during that time.   The diarrhea should go away and you should start having normal, complete, full bowel movements.  It may be helpful to start treatment when you can be near the comfort of your own bathroom, such as a weekend.    Toileting tips to help with your constipation - Drink at least 64-80 ounces of water/liquid per day. - Establish a time to try to move your  bowels every day.  For many people, this is after a cup of coffee or after a meal such as breakfast. - Sit all of the way back on the toilet keeping your back fairly straight and while sitting up, try to rest the tops of your forearms on your upper thighs.   - Raising your feet with a step stool/squatty potty can be helpful to improve the angle that allows your stool to pass through the rectum. - Relax the rectum feeling it bulge toward the toilet water.  If you feel your rectum raising toward your body, you are contracting rather than relaxing. - Breathe in and slowly exhale. "Belly breath" by expanding your belly towards your belly button. Keep belly expanded as you gently direct pressure down and back to the anus.  A low pitched GRRR sound can assist with increasing intra-abdominal pressure.  - Repeat 3-4 times. If unsuccessful, contract the pelvic floor to restore normal tone and get off the toilet.  Avoid excessive straining. - To reduce excessive wiping by teaching your anus to normally contract, place hands on outer aspect of knees and resist knee movement outward.  Hold 5-10 second then place hands just inside of knees and resist inward movement of knees.  Hold 5 seconds.  Repeat a few times each way.  Here some information about pelvic floor dysfunction. This may be contributing to some of your symptoms. We will continue with our evaluation but I do want you to consider  adding on fiber supplement with low-dose MiraLAX daily. We could also refer to pelvic floor physical therapy.   Pelvic Floor Dysfunction, Female Pelvic floor dysfunction (PFD) is a condition that results when the group of muscles and connective tissues that support the organs in the pelvis (pelvic floor muscles) do not work well. These muscles and their connections form a sling that supports the colon and bladder. In women, they also support the uterus. PFD causes pelvic floor muscles to be too weak, too tight, or both. In  PFD, muscle movements are not coordinated. This may cause bowel or bladder problems. It may also cause pain. What are the causes? This condition may be caused by an injury to the pelvic area or by a weakening of pelvic muscles. This often results from pregnancy and childbirth or other types of strain. In many cases, the exact cause is not known. What increases the risk? The following factors may make you more likely to develop this condition: Having chronic bladder tissue inflammation (interstitial cystitis). Being an older person. Being overweight. History of radiation treatment for cancer in the pelvic region. Previous pelvic surgery, such as removal of the uterus (hysterectomy). What are the signs or symptoms? Symptoms of this condition vary and may include: Bladder symptoms, such as: Trouble starting urination and emptying the bladder. Frequent urinary tract infections. Leaking urine when coughing, laughing, or exercising (stress incontinence). Having to pass urine urgently or frequently. Pain when passing urine. Bowel symptoms, such as: Constipation. Urgent or frequent bowel movements. Incomplete bowel movements. Painful bowel movements. Leaking stool or gas. Unexplained genital or rectal pain. Genital or rectal muscle spasms. Low back pain. Other symptoms may include: A heavy, full, or aching feeling in the vagina. A bulge that protrudes into the vagina. Pain during or after sex. How is this diagnosed? This condition may be diagnosed based on: Your symptoms and medical history. A physical exam. During the exam, your health care provider may check your pelvic muscles for tightness, spasm, pain, or weakness. This may include a rectal exam and a pelvic exam. In some cases, you may have diagnostic tests, such as: Electrical muscle function tests. Urine flow testing. X-ray tests of bowel function. Ultrasound of the pelvic organs. How is this treated? Treatment for this  condition depends on the symptoms. Treatment options include: Physical therapy. This may include Kegel exercises to help relax or strengthen the pelvic floor muscles. Biofeedback. This type of therapy provides feedback on how tight your pelvic floor muscles are so that you can learn to control them. Internal or external massage therapy. A treatment that involves electrical stimulation of the pelvic floor muscles to help control pain (transcutaneous electrical nerve stimulation, or TENS). Sound wave therapy (ultrasound) to reduce muscle spasms. Medicines, such as: Muscle relaxants. Bladder control medicines. Surgery to reconstruct or support pelvic floor muscles may be an option if other treatments do not help. Follow these instructions at home: Activity Do your usual activities as told by your health care provider. Ask your health care provider if you should modify any activities. Do pelvic floor strengthening or relaxing exercises at home as told by your physical therapist. Lifestyle Maintain a healthy weight. Eat foods that are high in fiber, such as beans, whole grains, and fresh fruits and vegetables. Limit foods that are high in fat and processed sugars, such as fried or sweet foods. Manage stress with relaxation techniques such as yoga or meditation. General instructions If you have problems with leakage: Use absorbable pads or  wear padded underwear. Wash frequently with mild soap. Keep your genital and anal area as clean and dry as possible. Ask your health care provider if you should try a barrier cream to prevent skin irritation. Take warm baths to relieve pelvic muscle tension or spasms. Take over-the-counter and prescription medicines only as told by your health care provider. Keep all follow-up visits. How is this prevented? The cause of PFD is not always known, but there are a few things you can do to reduce the risk of developing this condition, including: Staying at a  healthy weight. Getting regular exercise. Managing stress. Contact a health care provider if: Your symptoms are not improving with home care. You have signs or symptoms of PFD that get worse at home. You develop new signs or symptoms. You have signs of a urinary tract infection, such as: Fever. Chills. Increased urinary frequency. A burning feeling when urinating. You have not had a bowel movement in 3 days (constipation). Summary Pelvic floor dysfunction results when the muscles and connective tissues in your pelvic floor do not work well. These muscles and their connections form a sling that supports your colon and bladder. In women, they also support the uterus. PFD may be caused by an injury to the pelvic area or by a weakening of pelvic muscles. PFD causes pelvic floor muscles to be too weak, too tight, or a combination of both. Symptoms may vary from person to person. In most cases, PFD can be treated with physical therapies and medicines. Surgery may be an option if other treatments do not help. This information is not intended to replace advice given to you by your health care provider. Make sure you discuss any questions you have with your health care provider. Document Revised: 10/11/2020 Document Reviewed: 10/11/2020 Elsevier Patient Education  2022 [place].  Please take your proton pump inhibitor medication, protonix twice a day and pepcid once or twice a day  Please take this medication 30 minutes to 1 hour before meals- this makes it more effective.  Avoid spicy and acidic foods Avoid fatty foods Limit your intake of coffee, tea, alcohol, and carbonated drinks Work to maintain a healthy weight Keep the head of the bed elevated at least 3 inches with blocks or a wedge pillow if you are having any nighttime symptoms Stay upright for 2 hours after eating Avoid meals and snacks three to four hours before bedtime    B12 is mainly in meat, so increase meat can help  this but often people have a deficiency that they are just not absorbing it well with meat or pills, so get the sublingual/melt in your mouth one.   If the sublingual one dose not increase your level, we will discuss shots.   Vitamin B12 Deficiency Vitamin B12 deficiency occurs when the body does not have enough vitamin B12, which is an important vitamin. The body needs this vitamin: To make red blood cells. To make DNA. This is the genetic material inside cells. To help the nerves work properly so they can carry messages from the brain to the body. Vitamin B12 deficiency can cause various health problems, such as a low red blood cell count (anemia) or nerve damage. What are the causes? This condition may be caused by: Not eating enough foods that contain vitamin B12. Not having enough stomach acid and digestive fluids to properly absorb vitamin B12 from the food that you eat. Certain digestive system diseases that make it hard to absorb vitamin B12.  These diseases include Crohn's disease, chronic pancreatitis, and cystic fibrosis. A condition in which the body does not make enough of a protein (intrinsic factor), resulting in too few red blood cells (pernicious anemia). Having a surgery in which part of the stomach or small intestine is removed. Taking certain medicines that make it hard for the body to absorb vitamin B12. These medicines include: Heartburn medicines (antacids and proton pump inhibitors). Certain antibiotic medicines. Some medicines that are used to treat diabetes, tuberculosis, gout, or high cholesterol. What increases the risk? The following factors may make you more likely to develop a B12 deficiency: Being older than age 62. Eating a vegetarian or vegan diet, especially while you are pregnant. Eating a poor diet while you are pregnant. Taking certain medicines. Having alcoholism. What are the signs or symptoms? In some cases, there are no symptoms of this condition.  If the condition leads to anemia or nerve damage, various symptoms can occur, such as: Weakness. Fatigue. Loss of appetite. Weight loss. Numbness or tingling in your hands and feet. Redness and burning of the tongue. Confusion or memory problems. Depression. Sensory problems, such as color blindness, ringing in the ears, or loss of taste. Diarrhea or constipation. Trouble walking. If anemia is severe, symptoms can include: Shortness of breath. Dizziness. Rapid heart rate (tachycardia). How is this diagnosed? This condition may be diagnosed with a blood test to measure the level of vitamin B12 in your blood. You may also have other tests, including: A group of tests that measure certain characteristics of blood cells (complete blood count, CBC). A blood test to measure intrinsic factor. A procedure where a thin tube with a camera on the end is used to look into your stomach or intestines (endoscopy). Other tests may be needed to discover the cause of B12 deficiency. How is this treated? Treatment for this condition depends on the cause. This condition may be treated by: Changing your eating and drinking habits, such as: Eating more foods that contain vitamin B12. Drinking less alcohol or no alcohol. Getting vitamin B12 injections. Taking vitamin B12 supplements. Your health care provider will tell you which dosage is best for you. Follow these instructions at home: Eating and drinking  Eat lots of healthy foods that contain vitamin B12, including: Meats and poultry. This includes beef, pork, chicken, [place], and organ meats, such as liver. Seafood. This includes clams, rainbow trout, salmon, tuna, and haddock. Eggs. Cereal and dairy products that are fortified. This means that vitamin B12 has been added to the food. Check the label on the package to see if the food is fortified. The items listed above may not be a complete list of recommended foods and beverages. Contact a  dietitian for more information. General instructions Get any injections that are prescribed by your health care provider. Take supplements only as told by your health care provider. Follow the directions carefully. Do not drink alcohol if your health care provider tells you not to. In some cases, you may only be asked to limit alcohol use. Keep all follow-up visits as told by your health care provider. This is important. Contact a health care provider if: Your symptoms come back. Get help right away if you: Develop shortness of breath. Have a rapid heart rate. Have chest pain. Become dizzy or lose consciousness. Summary Vitamin B12 deficiency occurs when the body does not have enough vitamin B12. The main causes of vitamin B12 deficiency include dietary deficiency, digestive diseases, pernicious anemia, and having a  surgery in which part of the stomach or small intestine is removed. In some cases, there are no symptoms of this condition. If the condition leads to anemia or nerve damage, various symptoms can occur, such as weakness, shortness of breath, and numbness. Treatment may include getting vitamin B12 injections or taking vitamin B12 supplements. Eat lots of healthy foods that contain vitamin B12. This information is not intended to replace advice given to you by your health care provider. Make sure you discuss any questions you have with your health care provider. Document Revised: 11/20/2018 Document Reviewed: 02/10/2018 Elsevier Patient Education  2020 [employer].

## 2022-07-20 NOTE — Progress Notes (Signed)
Agree with assessment/plan.  Raj Fantashia Shupert, MD [facility] [phone]  

## 2022-07-22 NOTE — Telephone Encounter (Signed)
PT is calling to find out if her CT scan will be covered by insurance. Please advise.

## 2022-07-29 ENCOUNTER — Ambulatory Visit (HOSPITAL_COMMUNITY): Payer: Medicare HMO

## 2022-07-31 ENCOUNTER — Ambulatory Visit: Payer: Medicare HMO | Admitting: Gastroenterology

## 2022-08-07 ENCOUNTER — Encounter (HOSPITAL_COMMUNITY): Payer: Self-pay

## 2022-08-07 ENCOUNTER — Ambulatory Visit (HOSPITAL_COMMUNITY)
Admission: RE | Admit: 2022-08-07 | Discharge: 2022-08-07 | Disposition: A | Discharge status: Home / Self Care | Payer: Medicare HMO | Source: Ambulatory Visit | Attending: Physician Assistant | Admitting: Physician Assistant

## 2022-08-07 DIAGNOSIS — R140 Abdominal distension (gaseous): Secondary | ICD-10-CM | POA: Insufficient documentation

## 2022-08-07 DIAGNOSIS — R1011 Right upper quadrant pain: Secondary | ICD-10-CM | POA: Diagnosis present

## 2022-08-07 MED ORDER — IOHEXOL 300 MG/ML  SOLN
100.0000 mL | Freq: Once | INTRAMUSCULAR | Status: AC | PRN
Start: 2022-08-07 — End: 2022-08-07
  Administered 2022-08-07: 75 mL via INTRAVENOUS

## 2022-08-07 MED ORDER — SODIUM CHLORIDE (PF) 0.9 % IJ SOLN
INTRAMUSCULAR | Status: AC
  Filled 2022-08-07: qty 50

## 2022-08-11 ENCOUNTER — Other Ambulatory Visit: Payer: Self-pay | Admitting: Gastroenterology

## 2022-08-12 ENCOUNTER — Telehealth: Payer: Self-pay | Admitting: Physician Assistant

## 2022-08-12 DIAGNOSIS — K5904 Chronic idiopathic constipation: Secondary | ICD-10-CM

## 2022-08-12 DIAGNOSIS — K219 Gastro-esophageal reflux disease without esophagitis: Secondary | ICD-10-CM

## 2022-08-12 NOTE — Telephone Encounter (Signed)
Pt called for CT results. She states that her protonix that she takes BID is not working for her anymore. She states that when her stomach gets empty it hurts and when she eats something it hurts. Pt wonders if she needs a different stomach med. States we need to review her records because Dr. Ardis Hughs prescribed several meds before they found the protonix that helped. Please advise.

## 2022-08-12 NOTE — Telephone Encounter (Signed)
Inbound call from patient inquiring about results. Please advise.  Thank you

## 2022-08-13 NOTE — Telephone Encounter (Signed)
[name] is off. Please see note below and advise. I believe you were supervising when [name] saw the pt.

## 2022-08-13 NOTE — Telephone Encounter (Signed)
Patient called to follow up on previous message, please advise.

## 2022-08-15 ENCOUNTER — Telehealth: Payer: Self-pay | Admitting: Physician Assistant

## 2022-08-15 MED ORDER — SUCRALFATE 1 G PO TABS: ORAL_TABLET | ORAL | 1 refills | Status: DC

## 2022-08-15 MED ORDER — SUCRALFATE 1 GM/10ML PO SUSP: 1.0000 g | Freq: Four times a day (QID) | ORAL | 1 refills | Status: DC

## 2022-08-15 NOTE — Telephone Encounter (Signed)
Please see notes below and advise 

## 2022-08-15 NOTE — Telephone Encounter (Signed)
Spoke with pt and she is aware of results and recommendations per Dr. Lyndel Safe. Order in for KUB and pt knows to go 1 week after taking miralax. Script for carafate sent to pharmacy, pt aware.

## 2022-08-15 NOTE — Telephone Encounter (Signed)
Please advise. Spoke with pt and pt stated that one of her medications was coming up to $100 and she cannot afford it

## 2022-08-15 NOTE — Telephone Encounter (Signed)
Inbound call from patient request to speak with a nurse in reference a medication.Please advise

## 2022-08-15 NOTE — Telephone Encounter (Signed)
Patient called to follow up on previous message. Please advise.

## 2022-08-15 NOTE — Telephone Encounter (Signed)
Script sent in for carafate tabs which should be cheaper please let her know.

## 2022-08-15 NOTE — Telephone Encounter (Signed)
Previous note/labs/CT Abdo/pelvis reviewed  CT AP -no acute abnormalities.  But significant constipation specially right colon and transverse colon.  Has atherosclerosis but no obvious s/s of mesenteric ischemia.  Plan: -MiraLAX 17 g BID  until large BM, then QD. -Get x-ray KUB 2V in 1 week -Continue Protonix 40 BID -She has FU appt with [name]. -If she still has UGI problems, then will give her a trial of Carafate 1g PO QID x 2 weeks (Nl renal function) -She still has Abdo pain, mesenteric Doppler. -No need for any endoscopic procedures currently  RG

## 2022-08-19 ENCOUNTER — Telehealth: Payer: Self-pay

## 2022-08-19 NOTE — Telephone Encounter (Signed)
Called to check on pt to see how she is doing. States her abdominal pain is better. Pt states she is not taking the carafate pills that were prescribed. Reports it is to hard to take them due to spacing with other medications. Also reports she did take one but thinks it made her side hurt more. Pt reports her protonix does seem to be working better. Asked pt if she was coming for KUB, she thought he wanted her to come back in 2 weeks. Discussed with her he wanted it in 1 week but she states she has not had much change in her bowels so she does not think she needs to come for the xray. Discussed with her she could increase the miralax to 3 doses/day if 2 have not helped that much. Pt also states since it has been warmer she has been out walking and she thinks that is helping her also. She states she will keep her appt with [name] as scheduled.

## 2022-08-27 NOTE — Telephone Encounter (Signed)
Contacted pt and pt states she is not taking the Carafate tablets and also that her Protonix is not working for her.

## 2022-09-11 ENCOUNTER — Ambulatory Visit: Payer: Medicare HMO | Admitting: Physician Assistant

## 2022-11-10 ENCOUNTER — Other Ambulatory Visit: Payer: Self-pay | Admitting: Gastroenterology

## 2023-01-03 ENCOUNTER — Other Ambulatory Visit: Payer: Self-pay | Admitting: Gastroenterology

## 2023-05-11 ENCOUNTER — Other Ambulatory Visit: Payer: Self-pay | Admitting: Gastroenterology

## 2023-06-23 ENCOUNTER — Ambulatory Visit: Payer: Medicare HMO | Admitting: Internal Medicine

## 2023-06-26 ENCOUNTER — Emergency Department (HOSPITAL_COMMUNITY): Payer: HMO

## 2023-06-26 ENCOUNTER — Other Ambulatory Visit: Payer: Self-pay

## 2023-06-26 ENCOUNTER — Emergency Department (HOSPITAL_COMMUNITY)
Admission: EM | Admit: 2023-06-26 | Discharge: 2023-06-26 | Disposition: A | Discharge status: Home / Self Care | Payer: HMO | Attending: Emergency Medicine | Admitting: Emergency Medicine

## 2023-06-26 ENCOUNTER — Encounter (HOSPITAL_COMMUNITY): Payer: Self-pay | Admitting: Emergency Medicine

## 2023-06-26 DIAGNOSIS — F419 Anxiety disorder, unspecified: Secondary | ICD-10-CM | POA: Diagnosis not present

## 2023-06-26 DIAGNOSIS — R0789 Other chest pain: Secondary | ICD-10-CM | POA: Diagnosis present

## 2023-06-26 DIAGNOSIS — Z79899 Other long term (current) drug therapy: Secondary | ICD-10-CM | POA: Insufficient documentation

## 2023-06-26 LAB — CBC
HCT: 38.2 % (ref 36.0–46.0)
Hemoglobin: 12.7 g/dL (ref 12.0–15.0)
MCH: 31.6 pg (ref 26.0–34.0)
MCHC: 33.2 g/dL (ref 30.0–36.0)
MCV: 95 fL (ref 80.0–100.0)
Platelets: 309 10*3/uL (ref 150–400)
RBC: 4.02 MIL/uL (ref 3.87–5.11)
RDW: 12.3 % (ref 11.5–15.5)
WBC: 5 10*3/uL (ref 4.0–10.5)
nRBC: 0 % (ref 0.0–0.2)

## 2023-06-26 LAB — TROPONIN I (HIGH SENSITIVITY)
Troponin I (High Sensitivity): 8 ng/L (ref <18)
Troponin I (High Sensitivity): 9 ng/L (ref <18)

## 2023-06-26 LAB — BASIC METABOLIC PANEL
Anion gap: 8 (ref 5–15)
BUN: 13 mg/dL (ref 8–23)
CO2: 29 mmol/L (ref 22–32)
Calcium: 8.8 mg/dL — ABNORMAL LOW (ref 8.9–10.3)
Chloride: 97 mmol/L — ABNORMAL LOW (ref 98–111)
Creatinine, Ser: 0.61 mg/dL (ref 0.44–1.00)
GFR, Estimated: >60 mL/min (ref >60)
Glucose, Bld: 95 mg/dL (ref 70–99)
Potassium: 4 mmol/L (ref 3.5–5.1)
Sodium: 134 mmol/L — ABNORMAL LOW (ref 135–145)

## 2023-06-26 MED ORDER — HYDROXYZINE HCL 25 MG PO TABS: 25.0000 mg | ORAL_TABLET | Freq: Three times a day (TID) | ORAL | 0 refills | Status: DC | PRN

## 2023-06-26 NOTE — Discharge Instructions (Signed)
 Follow-up with Dr. Wyline Mood or one of his colleagues next week.  Return sooner if any problems

## 2023-06-26 NOTE — ED Provider Notes (Signed)
 [place] EMERGENCY DEPARTMENT AT [facility] Provider Note   CSN: [id number] Arrival date & time: 06/26/23  9171     History  Chief Complaint  Patient presents with   Chest Pain    Madison [name] is a 89 y.o. female.  Patient has a history of hiatal hernia.  She complains of some chest discomfort with sweating off and on for the last couple months.  She also just feels little nervous when this happens.  The history is provided by the patient and medical records. No language interpreter was used.  Chest Pain Pain location:  L chest Pain quality: aching   Pain radiates to:  Does not radiate Pain severity:  Mild Timing:  Intermittent Progression:  Waxing and waning Chronicity:  New Context: not breathing   Relieved by:  Nothing Worsened by:  Nothing Associated symptoms: no abdominal pain, no back pain, no cough, no fatigue and no headache        Home Medications Prior to Admission medications   Medication Sig Start Date End Date Taking? Authorizing Provider  hydrOXYzine  (ATARAX ) 25 MG tablet Take 1 tablet (25 mg total) by mouth every 8 (eight) hours as needed for anxiety. 06/26/23  Yes Castor Gittleman, MD  amLODipine (NORVASC) 2.5 MG tablet TAKE 1 TABLET BY MOUTH EVERY DAY 10/13/17   [provider]  bisacodyl (DULCOLAX) 5 MG EC tablet Take 5 mg by mouth daily as needed for moderate constipation.    [provider]  Calcium-Vitamin D-Vitamin K 500-100-40 MG-UNT-MCG CHEW Chew 2 each by mouth daily.      [provider]  clonazePAM  (KLONOPIN ) 0.25 MG disintegrating tablet Take 1 tablet (0.25 mg total) by mouth 2 (two) times daily. 08/25/19   Teressa Toribio SQUIBB, MD  DULoxetine  (CYMBALTA ) 30 MG capsule Take 1 capsule (30 mg total) by mouth daily. Patient not taking: Reported on 07/12/2022 12/22/19   Jenel Carlin POUR, MD  famotidine  (PEPCID ) 40 MG tablet TAKE 1 TABLET BY MOUTH EVERYDAY AT BEDTIME 05/12/23   Charlanne Groom, MD  hydrocortisone   (ANUSOL -HC) 2.5 % rectal cream Place 1 Application rectally 2 (two) times daily. 07/12/22   Craig Alan SAUNDERS, PA-C  hydrocortisone  (ANUSOL -HC) 25 MG suppository Place 1 suppository (25 mg total) rectally 2 (two) times daily. 07/12/22   Craig Alan SAUNDERS, PA-C  magnesium  gluconate (MAGONATE) 500 MG tablet Take 500 mg by mouth 2 (two) times daily.    [provider]  nitrofurantoin, macrocrystal-monohydrate, (MACROBID) 100 MG capsule Take 100 mg by mouth 2 (two) times daily. Patient not taking: Reported on 07/12/2022 10/05/21   [provider]  pantoprazole  (PROTONIX ) 40 MG tablet TAKE 1 TABLET BY MOUTH TWICE A DAY 30 MINUTES BEFORE BREAKFAST AND DINNER 01/03/23   Charlanne Groom, MD  polyethylene glycol (MIRALAX ) 17 g packet Take 17 g by mouth daily. 08/08/21   Teressa Toribio SQUIBB, MD  sucralfate  (CARAFATE ) 1 g tablet Dissolve tablet in 1 tablespoon of warm water and swallow 08/15/22   Charlanne Groom, MD  sucralfate  (CARAFATE ) 1 GM/10ML suspension Take 10 mLs (1 g total) by mouth 4 (four) times daily. 08/15/22   Charlanne Groom, MD      Allergies    Nitroglycerin and Sucralfate     Review of Systems   Review of Systems  Constitutional:  Negative for appetite change and fatigue.  HENT:  Negative for congestion, ear discharge and sinus pressure.   Eyes:  Negative for discharge.  Respiratory:  Negative for cough.  Cardiovascular:  Positive for chest pain.  Gastrointestinal:  Negative for abdominal pain and diarrhea.  Genitourinary:  Negative for frequency and hematuria.  Musculoskeletal:  Negative for back pain.  Skin:  Negative for rash.  Neurological:  Negative for seizures and headaches.  Psychiatric/Behavioral:  Negative for hallucinations.     Physical Exam Updated Vital Signs BP (!) 157/61 (BP Location: Left Arm)   Pulse 76   Temp 97.7 F (36.5 C)   Resp 12   Ht 5' 4 (1.626 m)   Wt 54.4 kg   SpO2 97%   BMI 20.60 kg/m  Physical Exam Vitals and nursing note reviewed.   Constitutional:      Appearance: She is well-developed.  HENT:     Head: Normocephalic.     Nose: Nose normal.  Eyes:     General: No scleral icterus.    Conjunctiva/sclera: Conjunctivae normal.  Neck:     Thyroid : No thyromegaly.  Cardiovascular:     Rate and Rhythm: Normal rate and regular rhythm.     Heart sounds: No murmur heard.    No friction rub. No gallop.  Pulmonary:     Breath sounds: No stridor. No wheezing or rales.  Chest:     Chest wall: No tenderness.  Abdominal:     General: There is no distension.     Tenderness: There is no abdominal tenderness. There is no rebound.  Musculoskeletal:        General: Normal range of motion.     Cervical back: Neck supple.  Lymphadenopathy:     Cervical: No cervical adenopathy.  Skin:    Findings: No erythema or rash.  Neurological:     Mental Status: She is alert and oriented to person, place, and time.     Motor: No abnormal muscle tone.     Coordination: Coordination normal.  Psychiatric:        Behavior: Behavior normal.     ED Results / Procedures / Treatments   Labs (all labs ordered are listed, but only abnormal results are displayed) Labs Reviewed  BASIC METABOLIC PANEL - Abnormal; Notable for the following components:      Result Value   Sodium 134 (*)    Chloride 97 (*)    Calcium 8.8 (*)    All other components within normal limits  CBC  TROPONIN I (HIGH SENSITIVITY)  TROPONIN I (HIGH SENSITIVITY)    EKG EKG Interpretation Date/Time:  Thursday June 26 2023 08:39:32 EST Ventricular Rate:  93 PR Interval:    QRS Duration:  78 QT Interval:  355 QTC Calculation: 442 R Axis:   45  Text Interpretation: Normal sinus rhythm Reconfirmed by Suzette Pac [phone]) on 06/26/2023 2:36:22 PM  Radiology DG Chest Port 1 View Result Date: 06/26/2023 CLINICAL DATA:  89 year old female with chest pain. EXAM: PORTABLE CHEST 1 VIEW COMPARISON:  Chest radiographs 04/18/2022 and earlier. CT Abdomen and Pelvis  08/07/2022. FINDINGS: Portable AP upright view at 0913 hours. Blunting of the left costophrenic angle is new from priors and suggests a small left pleural effusion. No superimposed pneumothorax, pulmonary edema or other confluent lung opacity. Right lung appears stable and negative. Calcified aortic atherosclerosis. Other mediastinal contours are within normal limits. Visualized tracheal air column is within normal limits. Stable visualized osseous structures. Negative visible bowel gas. IMPRESSION: 1. Evidence of a small new left pleural effusion. No other acute cardiopulmonary abnormality. 2. Aortic Atherosclerosis (ICD10-I70.0). Electronically Signed   By: VEAR Hurst M.D.   On:  06/26/2023 09:39    Procedures Procedures    Medications Ordered in ED Medications - No data to display  ED Course/ Medical Decision Making/ A&P                                 Medical Decision Making Amount and/or Complexity of Data Reviewed Labs: ordered. Radiology: ordered.  Risk Prescription drug management.  Patient with atypical chest discomfort.  She was offered admission to the hospital but preferred to go home.  She wanted something for anxiety and was prescribed Vistaril .  She will follow-up with cardiology for further evaluation        Final Clinical Impression(s) / ED Diagnoses Final diagnoses:  Atypical chest pain  Anxiety    Rx / DC Orders ED Discharge Orders          Ordered    hydrOXYzine  (ATARAX ) 25 MG tablet  Every 8 hours PRN        06/26/23 1446    Ambulatory referral to Cardiology       Comments: If you have not heard from the Cardiology office within the next 72 hours please call [phone].   06/26/23 1446              Suzette Pac, MD 06/30/23 1601

## 2023-06-26 NOTE — ED Triage Notes (Signed)
 Pt c/o of chest pain that comes and goes x1 week associated with hand shaking and possible anxiety. Also c/o of right ear pain. Pt denies pain at this time.

## 2023-06-30 ENCOUNTER — Emergency Department (HOSPITAL_COMMUNITY): Payer: HMO

## 2023-06-30 ENCOUNTER — Emergency Department (HOSPITAL_COMMUNITY)
Admission: EM | Admit: 2023-06-30 | Discharge: 2023-07-01 | Disposition: A | Discharge status: Home / Self Care | Payer: HMO | Attending: Emergency Medicine | Admitting: Emergency Medicine

## 2023-06-30 ENCOUNTER — Other Ambulatory Visit: Payer: Self-pay

## 2023-06-30 ENCOUNTER — Encounter (HOSPITAL_COMMUNITY): Payer: Self-pay | Admitting: Emergency Medicine

## 2023-06-30 DIAGNOSIS — K219 Gastro-esophageal reflux disease without esophagitis: Secondary | ICD-10-CM | POA: Diagnosis not present

## 2023-06-30 DIAGNOSIS — R079 Chest pain, unspecified: Secondary | ICD-10-CM | POA: Diagnosis present

## 2023-06-30 LAB — BASIC METABOLIC PANEL
Anion gap: 11 (ref 5–15)
BUN: 15 mg/dL (ref 8–23)
CO2: 26 mmol/L (ref 22–32)
Calcium: 9 mg/dL (ref 8.9–10.3)
Chloride: 96 mmol/L — ABNORMAL LOW (ref 98–111)
Creatinine, Ser: 0.5 mg/dL (ref 0.44–1.00)
GFR, Estimated: >60 mL/min (ref >60)
Glucose, Bld: 105 mg/dL — ABNORMAL HIGH (ref 70–99)
Potassium: 3.7 mmol/L (ref 3.5–5.1)
Sodium: 133 mmol/L — ABNORMAL LOW (ref 135–145)

## 2023-06-30 LAB — CBC
HCT: 36.8 % (ref 36.0–46.0)
Hemoglobin: 12.6 g/dL (ref 12.0–15.0)
MCH: 31.5 pg (ref 26.0–34.0)
MCHC: 34.2 g/dL (ref 30.0–36.0)
MCV: 92 fL (ref 80.0–100.0)
Platelets: 308 10*3/uL (ref 150–400)
RBC: 4 MIL/uL (ref 3.87–5.11)
RDW: 12.1 % (ref 11.5–15.5)
WBC: 5.7 10*3/uL (ref 4.0–10.5)
nRBC: 0 % (ref 0.0–0.2)

## 2023-06-30 LAB — TROPONIN I (HIGH SENSITIVITY): Troponin I (High Sensitivity): 16 ng/L (ref <18)

## 2023-06-30 NOTE — ED Triage Notes (Signed)
 Pt reports chest pain that started 1 month ago. Pt reports that pain is getting worse. Pt describes the pain as burning.

## 2023-06-30 NOTE — ED Provider Triage Note (Signed)
 Emergency Medicine Provider Triage Evaluation Note  Madison [name] , a 89 y.o. female  was evaluated in triage.  Pt complains of chest pain.  States that same has been intermittent in nature for the past month.  She was seen for same on 1/9 and had a negative workup.  She was discharged home with a referral to cardiology.  She called cardiology was told they could not see her until May.  She states that she is also having severe hot and cold flashes intermittently as well as some shortness of breath.  Denies any pain or swelling in her legs.  No PND or orthopnea.  No nausea, vomiting, or abdominal pain.  Review of Systems  Positive:  Negative:   Physical Exam  BP (!) 166/71 (BP Location: Right Arm)   Pulse 88   Temp 97.6 F (36.4 C) (Oral)   Resp 17   SpO2 100%  Gen:   Awake, no distress   Resp:  Normal effort  MSK:   Moves extremities without difficulty  Other:    Medical Decision Making  Medically screening exam initiated at 7:09 PM.  Appropriate orders placed.  Rosellen Lichtenberger was informed that the remainder of the evaluation will be completed by another provider, this initial triage assessment does not replace that evaluation, and the importance of remaining in the ED until their evaluation is complete.  Work-up initiated   Atticus Lemberger A, PA-C 06/30/23 1910

## 2023-07-01 LAB — TROPONIN I (HIGH SENSITIVITY): Troponin I (High Sensitivity): 15 ng/L (ref <18)

## 2023-07-01 MED ORDER — ALUM & MAG HYDROXIDE-SIMETH 200-200-20 MG/5ML PO SUSP
30.0000 mL | Freq: Once | ORAL | Status: AC
  Administered 2023-07-01: 30 mL via ORAL
  Filled 2023-07-01: qty 30

## 2023-07-01 MED ORDER — LIDOCAINE VISCOUS HCL 2 % MT SOLN
15.0000 mL | Freq: Once | OROMUCOSAL | Status: AC
  Administered 2023-07-01: 15 mL via ORAL
  Filled 2023-07-01: qty 15

## 2023-07-01 NOTE — Discharge Instructions (Signed)
 Please follow-up with your GI specialist in regards to recent symptoms and ER visit.  Today your labs and imaging were all reassuring and you most likely have exacerbation of your dyspepsia/acid reflux.  Please take your Protonix  at home as prescribed and monitor your symptoms.  If symptoms change or worsen please return to the ER.

## 2023-07-01 NOTE — ED Provider Notes (Signed)
 [place] EMERGENCY DEPARTMENT AT [facility] Provider Note   CSN: [id number] Arrival date & time: 06/30/23  1710     History  Chief Complaint  Patient presents with   Chest Pain    Madison [name] is a 89 y.o. female history of chronic dyspepsia/GERD presented for months of chest pain.  Patient states that this is similar to her GERD and that she was seen a few days ago and ultimately discharged after reassuring workup.  Patient states that last night she had another sensation of burning in her chest after eating dinner.  Patient did not take her Protonix  but states that the Protonix  does seem to help her symptoms.  Patient has not seen [facility] since her GI doc has retired.  Patient denies leg swelling, recent travel/hospitalization/surgeries, shortness of breath, hemoptysis, back pain, left arm or neck pain or vision changes or headache or fevers.  Patient been able to eat and drink without issue.    Home Medications Prior to Admission medications   Medication Sig Start Date End Date Taking? Authorizing Provider  amLODipine (NORVASC) 2.5 MG tablet TAKE 1 TABLET BY MOUTH EVERY DAY 10/13/17   [provider]  bisacodyl (DULCOLAX) 5 MG EC tablet Take 5 mg by mouth daily as needed for moderate constipation.    [provider]  Calcium-Vitamin D-Vitamin K 500-100-40 MG-UNT-MCG CHEW Chew 2 each by mouth daily.      [provider]  clonazePAM  (KLONOPIN ) 0.25 MG disintegrating tablet Take 1 tablet (0.25 mg total) by mouth 2 (two) times daily. 08/25/19   Teressa Toribio SQUIBB, MD  DULoxetine  (CYMBALTA ) 30 MG capsule Take 1 capsule (30 mg total) by mouth daily. Patient not taking: Reported on 07/12/2022 12/22/19   Jenel Carlin POUR, MD  famotidine  (PEPCID ) 40 MG tablet TAKE 1 TABLET BY MOUTH EVERYDAY AT BEDTIME 05/12/23   Charlanne Groom, MD  hydrocortisone  (ANUSOL -HC) 2.5 % rectal cream Place 1 Application rectally 2 (two) times daily. 07/12/22   Craig Alan SAUNDERS, PA-C  hydrocortisone  (ANUSOL -HC) 25 MG suppository Place 1 suppository (25 mg total) rectally 2 (two) times daily. 07/12/22   Craig Alan SAUNDERS, PA-C  hydrOXYzine  (ATARAX ) 25 MG tablet Take 1 tablet (25 mg total) by mouth every 8 (eight) hours as needed for anxiety. 06/26/23   Suzette Pac, MD  magnesium  gluconate (MAGONATE) 500 MG tablet Take 500 mg by mouth 2 (two) times daily.    [provider]  nitrofurantoin, macrocrystal-monohydrate, (MACROBID) 100 MG capsule Take 100 mg by mouth 2 (two) times daily. Patient not taking: Reported on 07/12/2022 10/05/21   [provider]  pantoprazole  (PROTONIX ) 40 MG tablet TAKE 1 TABLET BY MOUTH TWICE A DAY 30 MINUTES BEFORE BREAKFAST AND DINNER 01/03/23   Charlanne Groom, MD  polyethylene glycol (MIRALAX ) 17 g packet Take 17 g by mouth daily. 08/08/21   Teressa Toribio SQUIBB, MD  sucralfate  (CARAFATE ) 1 g tablet Dissolve tablet in 1 tablespoon of warm water and swallow 08/15/22   Charlanne Groom, MD  sucralfate  (CARAFATE ) 1 GM/10ML suspension Take 10 mLs (1 g total) by mouth 4 (four) times daily. 08/15/22   Charlanne Groom, MD      Allergies    Nitroglycerin and Sucralfate     Review of Systems   Review of Systems  Cardiovascular:  Positive for chest pain.    Physical Exam Updated Vital Signs BP (!) 161/81 (BP Location: Right Arm)   Pulse 88   Temp 98.3 F (36.8 C) (Oral)  Resp 16   SpO2 98%  Physical Exam Vitals reviewed.  Constitutional:      General: She is not in acute distress. HENT:     Head: Normocephalic and atraumatic.  Eyes:     Extraocular Movements: Extraocular movements intact.     Conjunctiva/sclera: Conjunctivae normal.     Pupils: Pupils are equal, round, and reactive to light.  Cardiovascular:     Rate and Rhythm: Normal rate and regular rhythm.     Pulses: Normal pulses.     Heart sounds: Normal heart sounds.     Comments: 2+ bilateral radial/dorsalis pedis pulses with regular rate Pulmonary:     Effort:  Pulmonary effort is normal. No respiratory distress.     Breath sounds: Normal breath sounds.  Abdominal:     Palpations: Abdomen is soft.     Tenderness: There is no abdominal tenderness. There is no guarding or rebound.  Musculoskeletal:        General: Normal range of motion.     Cervical back: Normal range of motion and neck supple.     Comments: 5 out of 5 bilateral grip/leg extension strength  Skin:    General: Skin is warm and dry.     Capillary Refill: Capillary refill takes less than 2 seconds.  Neurological:     General: No focal deficit present.     Mental Status: She is alert and oriented to person, place, and time.     Comments: Sensation intact in all 4 limbs  Psychiatric:        Mood and Affect: Mood normal.     ED Results / Procedures / Treatments   Labs (all labs ordered are listed, but only abnormal results are displayed) Labs Reviewed  BASIC METABOLIC PANEL - Abnormal; Notable for the following components:      Result Value   Sodium 133 (*)    Chloride 96 (*)    Glucose, Bld 105 (*)    All other components within normal limits  CBC  TROPONIN I (HIGH SENSITIVITY)  TROPONIN I (HIGH SENSITIVITY)    EKG EKG Interpretation Date/Time:  Tuesday July 01 2023 06:59:25 EST Ventricular Rate:  86 PR Interval:  150 QRS Duration:  72 QT Interval:  354 QTC Calculation: 423 R Axis:   53  Text Interpretation: Normal sinus rhythm Normal ECG Confirmed by Madison Fallow ([id number]) on 07/01/2023 11:09:33 AM  Radiology DG Chest 2 View Result Date: 06/30/2023 CLINICAL DATA:  Chest pain. EXAM: CHEST - 2 VIEW COMPARISON:  June 26, 2023 FINDINGS: The heart size and mediastinal contours are within normal limits. There is moderate to marked severity calcification of the aortic arch. The lungs are hyperinflated. Mild, diffuse, chronic appearing increased interstitial lung markings are present. There is no evidence of an acute infiltrate, pleural effusion or pneumothorax.  Multilevel degenerative changes seen throughout the thoracic spine. IMPRESSION: No acute or active cardiopulmonary disease. Electronically Signed   By: Suzen Dials M.D.   On: 06/30/2023 20:44    Procedures Procedures    Medications Ordered in ED Medications  alum & mag hydroxide-simeth (MAALOX/MYLANTA) 200-200-20 MG/5ML suspension 30 mL (has no administration in time range)    And  lidocaine  (XYLOCAINE ) 2 % viscous mouth solution 15 mL (has no administration in time range)    ED Course/ Medical Decision Making/ A&P  Medical Decision Making  Madison [name] 89 y.o. presented today for chest pain. Working DDx that I considered at this time includes, but not limited to, ACS, pulmonary embolism, community-acquired pneumonia, aortic dissection, pneumothorax, underlying bony abnormality, anemia, thyrotoxicosis, HTN urgency/emergency, esophageal rupture, CHF exacerbation, valvular disorder, myocarditis, pericarditis, endocarditis, pericardial effusion/cardiac tamponade, pulmonary edema, gastritis/PUD/GERD, esophagitis, MSK.  R/o Dx: ACS, pulmonary embolism, community-acquired pneumonia, aortic dissection, pneumothorax, underlying bony abnormality, anemia, thyrotoxicosis, HTN urgency/emergency, esophageal rupture, CHF exacerbation, valvular disorder, myocarditis, pericarditis, endocarditis, pericardial effusion/cardiac tamponade, pulmonary edema, MSK: These are considered less likely due to history of present illness and physical exam findings. Aortic Dissection: less likely based on the location, quality, onset, and severity of symptoms in this case. Patient also has a lack of underlying history of AD or TAA.   Review of prior external notes: 06/26/2023 ED provider  Unique Tests and My Interpretation:  EKG: Rate, rhythm, axis, intervals all examined and without medically relevant abnormality. ST segments without concerns for elevations Troponin: 16,  15 CXR: No acute findings CBC: Unremarkable BMP: Unremarkable  Social Determinants of Health: none  Discussion with Independent Historian:  Daughter  Discussion of Management of Tests: None  Risk: Medium: prescription drug management  Risk Stratification Score: none  Staffed with [place], MD  Plan: On exam patient was in no acute distress with stable vitals. Patient's physical was unremarkable.  Labs and imaging from triage were all reassuring including delta troponin.  Patient was seen on 06/26/2023 for similar symptoms and states that have not been any changes.  Patient states this does feel like her acid reflux and given her description of the chest pain of burning that gets better with Protonix  do feel her chest pain is GI related.  Patient does not have cardiac history and with delta troponins negative today and the other day she was seen highly doubt ACS or cardiogenic cause.  Will give GI cocktail here and encouraged her to use her Protonix  as prescribed and follow-up with [facility].  Do feel patient's episode last night was because she did not take the Protonix  and feel the patient is safe to be discharged.  Patient was given return precautions. Patient stable for discharge at this time.  Patient verbalized understanding of plan.  This chart was dictated using voice recognition software.  Despite best efforts to proofread,  errors can occur which can change the documentation meaning.         Final Clinical Impression(s) / ED Diagnoses Final diagnoses:  Gastroesophageal reflux disease, unspecified whether esophagitis present    Rx / DC Orders ED Discharge Orders     None         Victor Lynwood ONEIDA DEVONNA 07/01/23 1140    Madison Elsie CROME, MD 07/02/23 [phone]

## 2023-07-14 ENCOUNTER — Other Ambulatory Visit: Payer: Self-pay

## 2023-07-14 ENCOUNTER — Encounter: Payer: Self-pay | Admitting: Internal Medicine

## 2023-07-14 ENCOUNTER — Ambulatory Visit (INDEPENDENT_AMBULATORY_CARE_PROVIDER_SITE_OTHER): Payer: HMO | Admitting: Internal Medicine

## 2023-07-14 VITALS — BP 132/60 | HR 77 | Temp 98.1°F | Resp 12 | Ht 64.0 in | Wt 121.2 lb

## 2023-07-14 DIAGNOSIS — L500 Allergic urticaria: Secondary | ICD-10-CM

## 2023-07-14 NOTE — Patient Instructions (Addendum)
Rashes/ Pruritus:  - Take pictures/videos of rashes.  - Discussed possibility of hives.  At this time etiology of hives is unknown. Hives can be caused by a variety of different triggers including illness/infection, exercise, pressure, vibrations, extremes of temperature to name a few however majority of the time there is no identifiable trigger.  - Will do allergy testing at next visit to identify any triggers.  -Use Claritin 10mg  daily as needed for itching/hives/rashes.   Hold all anti-histamines (Xyzal, Allegra, Zyrtec, Claritin, Benadryl, Pepcid) 3 days prior to next visit.   Follow up: 2/3 at 930 AM for skin testing 1-68

## 2023-07-14 NOTE — Progress Notes (Signed)
NEW PATIENT  Date of Service/Encounter:  07/14/23  Consult requested by: Richmond Campbell., PA-C   Subjective:   Madison [name] (DOB: 07-25-1933) is a 89 y.o. female who presents to the clinic on 07/14/2023 with a chief complaint of Rash (States that when she gets hot she breaks out in rash.), Pruritus, and Urticaria .    History obtained from: chart review and patient.   Rashes: Reports onset about a year ago.  Mostly with warm weather or when she sweats.  Usually on back, armpits, upper chest. They are flat, itchy, red.   She has tried different deodorants without much relief. Also vacuumed and cleaned bed and then changed bedding without any improvement.  Shower at night helps.  Claritin PRN helps also.  Also reports being under a lot of stress with husband passing away after moving to a new home a year ago. Now has to manage finances.    Reviewed:  01/22/2023: seen by Arlyce Dice PA for rash/hives.  Reports worse when she is hot/sweats.  Resolves as the day goes by.  Changed her deodorant.  On pepcid for stomach issues but not any other anti histamines. Discussed use of Pepcid 20mg  BID.   06/30/2023: seen in ED for chest pain, thought to be related to GERD. EKG unremarkable with stable troponin x2. Discussed use of Protonix.   07/12/2022: seen by Shawn Route PA for constipation, IBS, RUQ, GERD, bloating, rectal ulcer. Discussed use of Linzess, hydrocortisone, PPI BID, Pepcid BID.  Also discussed further imaging.   Past Medical History: Past Medical History:  Diagnosis Date   Arthritis    Bladder infection    Cancer, skin, squamous cell    Colon polyps    Frozen shoulder    left    Gallstones    GERD (gastroesophageal reflux disease)    Headache syndrome 12/22/2019   Hiatal hernia    IBS (irritable bowel syndrome)    Osteopenia    Restless leg syndrome     Past Surgical History: Past Surgical History:  Procedure Laterality Date   APPENDECTOMY     CATARACT EXTRACTION  Bilateral    April and May 2021   CESAREAN SECTION     CHOLECYSTECTOMY     MOHS SURGERY Left    scalp- SCC    Family History: Family History  Problem Relation Age of Onset   Colon cancer Mother 45   Heart disease Father        MI   Heart disease Brother    Lung cancer Brother        x3   Diabetes Brother    Hypertension Brother    Bone cancer Sister    Lung cancer Sister        remission   Throat cancer Brother    Diabetes Maternal Grandmother    Colon polyps Neg Hx    Liver disease Neg Hx    Kidney disease Neg Hx     Social History:  Flooring in bedroom: wood Pets: none Tobacco use/exposure: none Job: homemaker   Medication List:  Allergies as of 07/14/2023       Reactions   Nitroglycerin Other (See Comments)   Passed out   Sucralfate Other (See Comments)   Patient states that it makes her stomach cramp.        Medication List        Accurate as of July 14, 2023 12:07 PM. If you have any questions, ask your nurse or doctor.  amLODipine 2.5 MG tablet Commonly known as: NORVASC TAKE 1 TABLET BY MOUTH EVERY DAY   bisacodyl 5 MG EC tablet Commonly known as: DULCOLAX Take 5 mg by mouth daily as needed for moderate constipation.   Calcium-Vitamin D-Vitamin K 500-100-40 MG-UNT-MCG Chew Chew 2 each by mouth daily.   clonazePAM 0.25 MG disintegrating tablet Commonly known as: KLONOPIN Take 1 tablet (0.25 mg total) by mouth 2 (two) times daily.   DULoxetine 30 MG capsule Commonly known as: Cymbalta Take 1 capsule (30 mg total) by mouth daily.   famotidine 40 MG tablet Commonly known as: PEPCID TAKE 1 TABLET BY MOUTH EVERYDAY AT BEDTIME   hydrocortisone 2.5 % rectal cream Commonly known as: Anusol-HC Place 1 Application rectally 2 (two) times daily.   hydrocortisone 25 MG suppository Commonly known as: ANUSOL-HC Place 1 suppository (25 mg total) rectally 2 (two) times daily.   hydrOXYzine 25 MG tablet Commonly known as:  ATARAX Take 1 tablet (25 mg total) by mouth every 8 (eight) hours as needed for anxiety.   loratadine 10 MG tablet Commonly known as: CLARITIN Take by mouth.   magnesium gluconate 500 MG tablet Commonly known as: MAGONATE Take 500 mg by mouth 2 (two) times daily.   nitrofurantoin (macrocrystal-monohydrate) 100 MG capsule Commonly known as: MACROBID Take 100 mg by mouth 2 (two) times daily.   pantoprazole 40 MG tablet Commonly known as: PROTONIX TAKE 1 TABLET BY MOUTH TWICE A DAY 30 MINUTES BEFORE BREAKFAST AND DINNER   polyethylene glycol 17 g packet Commonly known as: MiraLax Take 17 g by mouth daily.   sucralfate 1 GM/10ML suspension Commonly known as: CARAFATE Take 10 mLs (1 g total) by mouth 4 (four) times daily.   sucralfate 1 g tablet Commonly known as: Carafate Dissolve tablet in 1 tablespoon of warm water and swallow         REVIEW OF SYSTEMS: Pertinent positives and negatives discussed in HPI.   Objective:   Physical Exam: BP 132/60   Pulse 77   Temp 98.1 F (36.7 C)   Resp 12   Ht 5\' 4"  (1.626 m)   Wt 121 lb 3.2 oz (55 kg)   SpO2 100%   BMI 20.80 kg/m  Body mass index is 20.8 kg/m. GEN: alert, well developed HEENT: clear conjunctiva, MMM HEART: regular rate and rhythm, no murmur LUNGS: clear to auscultation bilaterally, no coughing, unlabored respiration ABDOMEN: soft, non distended  SKIN: no rashes or lesions   Assessment:   1. Allergic urticaria     Plan/Recommendations:  Rashes/ Pruritus:  - Take pictures/videos of rashes. Described as itchy, red worse with warm weather/sweating.  - Discussed possibility of hives.  At this time etiology of hives is unknown. Hives can be caused by a variety of different triggers including illness/infection, exercise, pressure, vibrations, extremes of temperature to name a few however majority of the time there is no identifiable trigger.  - Will do allergy testing at next visit to identify any  triggers.  -Use Claritin 10mg  daily as needed for itching/hives/rashes.   Hold all anti-histamines (Xyzal, Allegra, Zyrtec, Claritin, Benadryl, Pepcid) 3 days prior to next visit.   Follow up: 2/3 at 930 AM for skin testing 1-68  Alesia Morin, MD [facility]

## 2023-07-17 ENCOUNTER — Encounter: Payer: Self-pay | Admitting: Emergency Medicine

## 2023-07-17 ENCOUNTER — Ambulatory Visit
Admission: EM | Admit: 2023-07-17 | Discharge: 2023-07-17 | Disposition: A | Discharge status: Home / Self Care | Payer: HMO | Attending: Family Medicine | Admitting: Family Medicine

## 2023-07-17 ENCOUNTER — Other Ambulatory Visit: Payer: Self-pay

## 2023-07-17 DIAGNOSIS — H6991 Unspecified Eustachian tube disorder, right ear: Secondary | ICD-10-CM

## 2023-07-17 MED ORDER — AZELASTINE HCL 0.1 % NA SOLN: 1.0000 | Freq: Two times a day (BID) | NASAL | 1 refills | Status: DC

## 2023-07-17 NOTE — Discharge Instructions (Addendum)
You do not have an ear infection today.  I suspect your ear pain is related to eustachian tube dysfunction causing fluid pressure to build up in the inner ear.  Because of your upcoming allergy testing on Monday, we have to be very careful with what we use for this.  Usually, we would give you a course of a steroid such as prednisone and decongestants such as Coricidin HBP in addition to a nasal spray consistently.  I believe he should be able to use the Astelin nasal spray but check with your allergist prior to use because of your upcoming testing.  The Coricidin HBP and prednisone would probably impact your upcoming test so we will forego these at this time.

## 2023-07-17 NOTE — ED Provider Notes (Signed)
[facility]    CSN: [id number] Arrival date & time: 07/17/23  0813      History   Chief Complaint Chief Complaint  Patient presents with   Otalgia    HPI Madison [name] is a 89 y.o. female.   Presenting today with several month history of right ear pain intermittently that has become severe and constant over the last few days.  Denies fever, congestion, drainage from the ear, loss of hearing.  Taking Tylenol with mild temporary benefit.   Past Medical History:  Diagnosis Date   Arthritis    Bladder infection    Cancer, skin, squamous cell    Colon polyps    Frozen shoulder    left    Gallstones    GERD (gastroesophageal reflux disease)    Headache syndrome 12/22/2019   Hiatal hernia    IBS (irritable bowel syndrome)    Osteopenia    Restless leg syndrome     Patient Active Problem List   Diagnosis Date Noted   Headache syndrome 12/22/2019   Hoarseness 12/05/2017   Urinary tract infectious disease 04/21/2017   Anxiety 10/17/2015   Chronic rhinitis 10/17/2015   Fatigue 10/17/2015   CLAUDICATION 08/31/2010   CHEST PAIN 08/31/2010   Claudication (HCC) 08/31/2010   GERD 10/14/2007    Past Surgical History:  Procedure Laterality Date   APPENDECTOMY     CATARACT EXTRACTION Bilateral    April and May 2021   CESAREAN SECTION     CHOLECYSTECTOMY     MOHS SURGERY Left    scalp- SCC    OB History   No obstetric history on file.      Home Medications    Prior to Admission medications   Medication Sig Start Date End Date Taking? Authorizing Provider  azelastine (ASTELIN) 0.1 % nasal spray Place 1 spray into both nostrils 2 (two) times daily. Use in each nostril as directed 07/17/23  Yes Particia Nearing, PA-C  amLODipine (NORVASC) 2.5 MG tablet TAKE 1 TABLET BY MOUTH EVERY DAY 10/13/17   [provider]  bisacodyl (DULCOLAX) 5 MG EC tablet Take 5 mg by mouth daily as needed for moderate constipation. Patient not taking:  Reported on 07/14/2023    [provider]  Calcium-Vitamin D-Vitamin K 500-100-40 MG-UNT-MCG CHEW Chew 2 each by mouth daily.      [provider]  clonazePAM (KLONOPIN) 0.25 MG disintegrating tablet Take 1 tablet (0.25 mg total) by mouth 2 (two) times daily. 08/25/19   Rachael Fee, MD  DULoxetine (CYMBALTA) 30 MG capsule Take 1 capsule (30 mg total) by mouth daily. Patient not taking: Reported on 07/14/2023 12/22/19   York Spaniel, MD  famotidine (PEPCID) 40 MG tablet TAKE 1 TABLET BY MOUTH EVERYDAY AT BEDTIME 05/12/23   Lynann Bologna, MD  hydrocortisone (ANUSOL-HC) 2.5 % rectal cream Place 1 Application rectally 2 (two) times daily. Patient not taking: Reported on 07/14/2023 07/12/22   Doree Albee, PA-C  hydrocortisone (ANUSOL-HC) 25 MG suppository Place 1 suppository (25 mg total) rectally 2 (two) times daily. 07/12/22   Doree Albee, PA-C  hydrOXYzine (ATARAX) 25 MG tablet Take 1 tablet (25 mg total) by mouth every 8 (eight) hours as needed for anxiety. Patient not taking: Reported on 07/14/2023 06/26/23   Bethann Berkshire, MD  loratadine (CLARITIN) 10 MG tablet Take by mouth.    [provider]  magnesium gluconate (MAGONATE) 500 MG tablet Take 500 mg by mouth 2 (two) times  daily.    [provider]  nitrofurantoin, macrocrystal-monohydrate, (MACROBID) 100 MG capsule Take 100 mg by mouth 2 (two) times daily. Patient not taking: Reported on 07/14/2023 10/05/21   [provider]  pantoprazole (PROTONIX) 40 MG tablet TAKE 1 TABLET BY MOUTH TWICE A DAY 30 MINUTES BEFORE BREAKFAST AND DINNER 01/03/23   Lynann Bologna, MD  polyethylene glycol (MIRALAX) 17 g packet Take 17 g by mouth daily. Patient not taking: Reported on 07/14/2023 08/08/21   Rachael Fee, MD  sucralfate (CARAFATE) 1 g tablet Dissolve tablet in 1 tablespoon of warm water and swallow Patient not taking: Reported on 07/14/2023 08/15/22   Lynann Bologna, MD  sucralfate (CARAFATE) 1  GM/10ML suspension Take 10 mLs (1 g total) by mouth 4 (four) times daily. Patient not taking: Reported on 07/14/2023 08/15/22   Lynann Bologna, MD    Family History Family History  Problem Relation Age of Onset   Colon cancer Mother 64   Heart disease Father        MI   Heart disease Brother    Lung cancer Brother        x3   Diabetes Brother    Hypertension Brother    Bone cancer Sister    Lung cancer Sister        remission   Throat cancer Brother    Diabetes Maternal Grandmother    Colon polyps Neg Hx    Liver disease Neg Hx    Kidney disease Neg Hx     Social History Social History   Tobacco Use   Smoking status: Never    Passive exposure: Past   Smokeless tobacco: Never  Vaping Use   Vaping status: Never Used  Substance Use Topics   Alcohol use: Not Currently    Alcohol/week: 0.0 standard drinks of alcohol    Comment: occasionally wine   Drug use: No     Allergies   Nitroglycerin and Sucralfate   Review of Systems Review of Systems PER HPI  Physical Exam Triage Vital Signs ED Triage Vitals  Encounter Vitals Group     BP 07/17/23 0850 129/77     Systolic BP Percentile --      Diastolic BP Percentile --      Pulse Rate 07/17/23 0850 96     Resp 07/17/23 0850 16     Temp 07/17/23 0850 97.9 F (36.6 C)     Temp Source 07/17/23 0850 Oral     SpO2 07/17/23 0850 95 %     Weight --      Height --      Head Circumference --      Peak Flow --      Pain Score 07/17/23 0849 5     Pain Loc --      Pain Education --      Exclude from Growth Chart --    No data found.  Updated Vital Signs BP 129/77 (BP Location: Right Arm)   Pulse 96   Temp 97.9 F (36.6 C) (Oral)   Resp 16   SpO2 95%   Visual Acuity Right Eye Distance:   Left Eye Distance:   Bilateral Distance:    Right Eye Near:   Left Eye Near:    Bilateral Near:     Physical Exam Vitals and nursing note reviewed.  Constitutional:      Appearance: Normal appearance. She is not  ill-appearing.  HENT:     Head: Atraumatic.  Ears:     Comments: Bilateral middle ear effusions, worse on the right.  No TM erythema, purulence    Nose: Nose normal.     Mouth/Throat:     Mouth: Mucous membranes are moist.     Pharynx: Oropharynx is clear. No posterior oropharyngeal erythema.  Eyes:     Extraocular Movements: Extraocular movements intact.     Conjunctiva/sclera: Conjunctivae normal.  Cardiovascular:     Rate and Rhythm: Normal rate and regular rhythm.     Heart sounds: Normal heart sounds.  Pulmonary:     Effort: Pulmonary effort is normal.     Breath sounds: Normal breath sounds.  Musculoskeletal:        General: Normal range of motion.     Cervical back: Normal range of motion and neck supple.  Skin:    General: Skin is warm and dry.  Neurological:     Mental Status: She is alert and oriented to person, place, and time.  Psychiatric:        Mood and Affect: Mood normal.        Thought Content: Thought content normal.        Judgment: Judgment normal.      UC Treatments / Results  Labs (all labs ordered are listed, but only abnormal results are displayed) Labs Reviewed - No data to display  EKG   Radiology No results found.  Procedures Procedures (including critical care time)  Medications Ordered in UC Medications - No data to display  Initial Impression / Assessment and Plan / UC Course  I have reviewed the triage vital signs and the nursing notes.  Pertinent labs & imaging results that were available during my care of the patient were reviewed by me and considered in my medical decision making (see chart for details).     No evidence of an ear infection today.  Suspect eustachian tube dysfunction causing her symptoms.  Unfortunately, she states that she has allergy testing coming up on Monday and she is unable to take medications such as steroids or antihistamines.  Will trial Astelin nasal spray while awaiting her allergy testing and  if still not resolving with this follow-up for recheck and potentially adding the other medications that may benefit her.  Final Clinical Impressions(s) / UC Diagnoses   Final diagnoses:  Acute dysfunction of right eustachian tube     Discharge Instructions      You do not have an ear infection today.  I suspect your ear pain is related to eustachian tube dysfunction causing fluid pressure to build up in the inner ear.  Because of your upcoming allergy testing on Monday, we have to be very careful with what we use for this.  Usually, we would give you a course of a steroid such as prednisone and decongestants such as Coricidin HBP in addition to a nasal spray consistently.  I believe he should be able to use the Astelin nasal spray but check with your allergist prior to use because of your upcoming testing.  The Coricidin HBP and prednisone would probably impact your upcoming test so we will forego these at this time.    ED Prescriptions     Medication Sig Dispense Auth. Provider   azelastine (ASTELIN) 0.1 % nasal spray Place 1 spray into both nostrils 2 (two) times daily. Use in each nostril as directed 30 mL Particia Nearing, PA-C      PDMP not reviewed this encounter.   Particia Nearing, [place]  07/17/23 0941  

## 2023-07-17 NOTE — ED Triage Notes (Signed)
Pt reports right ear pain for last several months and reports last few days pain has gotten more severe. Denies any known injury or foreign body.

## 2023-07-21 ENCOUNTER — Ambulatory Visit: Payer: HMO | Admitting: Internal Medicine

## 2023-07-21 DIAGNOSIS — L500 Allergic urticaria: Secondary | ICD-10-CM

## 2023-07-21 NOTE — Patient Instructions (Addendum)
Rashes/ Pruritus:  - Skin test 07/2023: negative to foods and aeroallergens  -Take pictures/videos of rashes.  -Use Claritin 10mg  daily to help with rashes/itching. -Recommend follow up with Dermatology.

## 2023-07-21 NOTE — Progress Notes (Signed)
FOLLOW UP Date of Service/Encounter:  07/21/23   Subjective:  Madison [name] (DOB: 1934-01-14) is a 89 y.o. female who [facility] for follow up for skin testing.   History obtained from: chart review and patient.  Anti histamines held.  She is worried this is a hormonal issue and is planning to see obgyn.  She sees a dermatologist regularly and is planning to see them soon.   Objective:  There were no vitals taken for this visit. There is no height or weight on file to calculate BMI. Physical Exam: GEN: alert, well developed HEENT: no rhinorrhea  HEART: regular rate  LUNGS:  no coughing, unlabored respiration SKIN: no rashes    Skin Testing:  Skin prick testing was placed, which includes aeroallergens/foods, histamine control, and saline control.  Verbal consent was obtained prior to placing test.  Patient tolerated procedure well.  Allergy testing results were read and interpreted by myself, documented by clinical staff. Adequate positive and negative control.  Positive results to:  Results discussed with patient/family.  Airborne Adult Perc - 07/21/23 0938     Time Antigen Placed 1829    Allergen Manufacturer Waynette Buttery    Location Back    Number of Test 55    1. Control-Buffer 50% Glycerol Negative    2. Control-Histamine 3+    3. Bahia Negative    4. French Southern Territories Negative    5. Johnson Negative    6. Kentucky Blue Negative    7. Meadow Fescue Negative    8. Perennial Rye Negative    9. Timothy Negative    10. Ragweed Mix Negative    11. Cocklebur Negative    12. Plantain,  English Negative    13. Baccharis Negative    14. Dog Fennel Negative    15. Russian Thistle Negative    16. Lamb's Quarters Negative    17. Sheep Sorrell Negative    18. Rough Pigweed Negative    19. Marsh Elder, Rough Negative    20. Mugwort, Common Negative    21. Box, Elder Negative    22. Cedar, red Negative    23. Sweet Gum Negative    24.  Pecan Pollen Negative    25. Pine Mix Negative    26. Walnut, Black Pollen Negative    27. Red Mulberry Negative    28. Ash Mix Negative    29. Birch Mix Negative    30. Beech American Negative    31. Cottonwood, Guinea-Bissau Negative    32. Hickory, White Negative    33. Maple Mix Negative    34. Oak, Guinea-Bissau Mix Negative    35. Sycamore Eastern Negative    36. Alternaria Alternata Negative    37. Cladosporium Herbarum Negative    38. Aspergillus Mix Negative    39. Penicillium Mix Negative    40. Bipolaris Sorokiniana (Helminthosporium) Negative    41. Drechslera Spicifera (Curvularia) Negative    42. Mucor Plumbeus Negative    43. Fusarium Moniliforme Negative    44. Aureobasidium Pullulans (pullulara) Negative    45. Rhizopus Oryzae Negative    46. Botrytis Cinera Negative    47. Epicoccum Nigrum Negative    48. Phoma Betae Negative    49. Dust Mite Mix Negative    50. Cat Hair 10,000 BAU/ml Negative    51.  Dog Epithelia Negative    52. Mixed Feathers Negative    53. Horse Epithelia Negative  54. Cockroach, German Negative    55. Tobacco Leaf Negative             13 Food Perc - 07/21/23 0938       Test Information   Time Antigen Placed 1610    Allergen Manufacturer Waynette Buttery    Location Back    Number of allergen test 13      Food   1. Peanut Negative    2. Soybean Negative    3. Wheat Negative    4. Sesame Negative    5. Milk, Cow Negative    6. Casein Negative    7. Egg White, Chicken Negative    8. Shellfish Mix Negative    9. Fish Mix Negative    10. Cashew Negative    11. Walnut Food Negative    12. Almond Negative    13. Hazelnut Negative              Assessment:   1. Allergic urticaria     Plan/Recommendations:   Rashes/ Pruritus:  - Take pictures/videos of rashes. Described as itchy, red worse with warm weather/sweating. Discussed possibility of hives.  At this time etiology of hives is unknown. Hives can be caused by a variety of  different triggers including illness/infection, exercise, pressure, vibrations, extremes of temperature to name a few however majority of the time there is no identifiable trigger.  - SPT 07/2023: negative to environmental and food allergens.  -Use Claritin 10mg  daily. -Recommend follow up with Dermatology.  -She will discuss referral to obgyn with her PCP due to issues with hormonal changes.      Return if symptoms worsen or fail to improve.  Alesia Morin, MD [facility]

## 2023-08-09 ENCOUNTER — Other Ambulatory Visit: Payer: Self-pay | Admitting: Family Medicine

## 2023-09-16 ENCOUNTER — Ambulatory Visit: Payer: HMO | Admitting: Internal Medicine

## 2023-11-04 ENCOUNTER — Ambulatory Visit: Payer: HMO | Admitting: Cardiology

## 2024-02-18 ENCOUNTER — Other Ambulatory Visit: Payer: Self-pay | Admitting: Gastroenterology

## 2024-03-19 ENCOUNTER — Ambulatory Visit: Admitting: Gastroenterology

## 2024-03-19 ENCOUNTER — Encounter: Payer: Self-pay | Admitting: Gastroenterology

## 2024-03-19 VITALS — BP 138/70 | Temp 88.0°F | Ht 64.0 in | Wt 115.1 lb

## 2024-03-19 DIAGNOSIS — K219 Gastro-esophageal reflux disease without esophagitis: Secondary | ICD-10-CM | POA: Diagnosis not present

## 2024-03-19 DIAGNOSIS — K5904 Chronic idiopathic constipation: Secondary | ICD-10-CM

## 2024-03-19 NOTE — Patient Instructions (Addendum)
 Recommend high fiber diet Continue OTC Miralax  po daily can take twice daily if needed Continue stool softeners Continue glycerin suppositories as needed  GERD Recommend GERD diet

## 2024-03-19 NOTE — Progress Notes (Signed)
 Chief Complaint: chronic constipation  Primary GI Doctor:Dr. Charlanne   HPI:  Patient is a  89  year old female patient with past medical history of GERD, chronic constipation, and hypertension,  who presents for a follow-up to discuss chronic constipation.  Patient last seen in GI office by Alan, PA on 07/12/22 for abdominal pain.  Interval History    Patient presents for follow-up on chronic constipation. She has started to incorporate more fiber into her diet and added flax seed and prunes. She uses OTC Miralax  po daily and stool softeners at bedtime each night.  She uses over-the-counter glycerin suppositories as needed. No abdominal pain or blood in stool. She reports the last two days she has no issues with constipation. She walks daily and does body weight exercises.  Patient has history of GERD and currently taking Pantoprazole  40 mg po daily. She reports this controls her symptoms.   Wt Readings from Last 3 Encounters:  03/19/24 115 lb 2 oz (52.2 kg)  07/14/23 121 lb 3.2 oz (55 kg)  06/26/23 120 lb (54.4 kg)      Past Medical History:  Diagnosis Date   Arthritis    Bladder infection    Cancer, skin, squamous cell    Colon polyps    Frozen shoulder    left    Gallstones    GERD (gastroesophageal reflux disease)    Headache syndrome 12/22/2019   Hiatal hernia    IBS (irritable bowel syndrome)    Osteopenia    Restless leg syndrome     Past Surgical History:  Procedure Laterality Date   APPENDECTOMY     CATARACT EXTRACTION Bilateral    April and Berneita Sanagustin 2021   CESAREAN SECTION     CHOLECYSTECTOMY     MOHS SURGERY Left    scalp- SCC    Current Outpatient Medications  Medication Sig Dispense Refill   amLODipine (NORVASC) 2.5 MG tablet TAKE 1 TABLET BY MOUTH EVERY DAY     magnesium  gluconate (MAGONATE) 500 MG tablet Take 500 mg by mouth 2 (two) times daily.     pantoprazole  (PROTONIX ) 40 MG tablet Take 40 mg by mouth daily.     polyethylene glycol (MIRALAX ) 17 g  packet Take 17 g by mouth daily. 14 each 0   Calcium-Vitamin D-Vitamin K 500-100-40 MG-UNT-MCG CHEW Chew 2 each by mouth daily.       No current facility-administered medications for this visit.    Allergies as of 03/19/2024 - Review Complete 03/19/2024  Allergen Reaction Noted   Nitroglycerin Other (See Comments) 11/16/2015   Sucralfate  Other (See Comments) 05/22/2017    Family History  Problem Relation Age of Onset   Colon cancer Mother 27   Heart disease Father        MI   Lung cancer Sister    Bone cancer Sister    Heart disease Brother    Lung cancer Brother        x3   Lung cancer Brother    Diabetes Brother    Lung cancer Brother    Hypertension Brother    Diabetes Maternal Grandmother    Colon polyps Neg Hx    Liver disease Neg Hx    Kidney disease Neg Hx    Esophageal cancer Neg Hx    Pancreatic cancer Neg Hx    Rectal cancer Neg Hx     Review of Systems:    Constitutional: No weight loss, fever, chills, weakness or fatigue HEENT: Eyes: No  change in vision               Ears, Nose, Throat:  No change in hearing or congestion Skin: No rash or itching Cardiovascular: No chest pain, chest pressure or palpitations   Respiratory: No SOB or cough Gastrointestinal: See HPI and otherwise negative Genitourinary: No dysuria or change in urinary frequency Neurological: No headache, dizziness or syncope Musculoskeletal: No new muscle or joint pain Hematologic: No bleeding or bruising Psychiatric: No history of depression or anxiety    Physical Exam:  Vital signs: BP 138/70   Temp (!) 88 F (31.1 C)   Ht 5' 4 (1.626 m)   Wt 115 lb 2 oz (52.2 kg)   BMI 19.76 kg/m   Constitutional:   Pleasant female appears to be in NAD, Well developed, Well nourished, alert and cooperative Throat: Oral cavity and pharynx without inflammation, swelling or lesion.  Respiratory: Respirations even and unlabored. Lungs clear to auscultation bilaterally.   No wheezes, crackles, or  rhonchi.  Cardiovascular: Normal S1, S2. Regular rate and rhythm. No peripheral edema, cyanosis or pallor.  Gastrointestinal:  Soft, nondistended, nontender. No rebound or guarding. Normal bowel sounds. No appreciable masses or hepatomegaly. Rectal:  Not performed.  Msk:  Symmetrical without gross deformities. Without edema, no deformity or joint abnormality.  Neurologic:  Alert and  oriented x4;  grossly normal neurologically.  Skin:   Dry and intact without significant lesions or rashes.  RELEVANT LABS AND IMAGING: CBC    Latest Ref Rng & Units 06/30/2023    7:15 PM 06/26/2023    9:07 AM 04/17/2021    9:13 AM  CBC  WBC 4.0 - 10.5 K/uL 5.7  5.0  5.8   Hemoglobin 12.0 - 15.0 g/dL 87.3  87.2  87.2   Hematocrit 36.0 - 46.0 % 36.8  38.2  37.0   Platelets 150 - 400 K/uL 308  309  299.0      CMP     Latest Ref Rng & Units 06/30/2023    7:15 PM 06/26/2023    9:07 AM 08/08/2021    9:49 AM  CMP  Glucose 70 - 99 mg/dL 894  95  95   BUN 8 - 23 mg/dL 15  13  11    Creatinine 0.44 - 1.00 mg/dL 9.49  9.38  9.37   Sodium 135 - 145 mmol/L 133  134  133   Potassium 3.5 - 5.1 mmol/L 3.7  4.0  4.1   Chloride 98 - 111 mmol/L 96  97  96   CO2 22 - 32 mmol/L 26  29  33   Calcium 8.9 - 10.3 mg/dL 9.0  8.8  9.5      Lab Results  Component Value Date   TSH 2.17 04/17/2021  07/2022 CTAP IMPRESSION: 1. No acute intra-abdominal or pelvic pathology. 2. No bowel obstruction. 3.  Aortic Atherosclerosis (ICD10-I70.0).  Assessment: Encounter Diagnoses  Name Primary?   Chronic idiopathic constipation Yes   Gastroesophageal reflux disease, unspecified whether esophagitis present      89 year old female patient that presents for follow-up on chronic constipation and GERD.  Patient's GERD is currently managed on PPI therapy with no changes needed.  Patient reports her chronic constipation has improved with incorporating more fiber rich foods into her daily regimen.  We did discuss titrating the MiraLAX  as  needed.  She can continue the stool softeners and glycerin suppositories as needed. CTAP from 2/24 was negative.  Plan: -recommend high fiber diet - Continue  MiraLAX  can titrate to effect -Stool softeners as needed -Glycerin suppositories as needed -Recommend GERD diet -Continue pantoprazole  40 mg p.o. daily -follow-up as needed  Thank you for the courtesy of this consult. Please call me with any questions or concerns.   Abdulaziz Toman, FNP-C [facility] 03/19/2024, 3:52 PM  Cc: Debrah Josette ORN., PA-C

## 2024-04-14 ENCOUNTER — Emergency Department (HOSPITAL_COMMUNITY)

## 2024-04-14 ENCOUNTER — Emergency Department (HOSPITAL_COMMUNITY)
Admission: EM | Admit: 2024-04-14 | Discharge: 2024-04-14 | Disposition: A | Discharge status: Home / Self Care | Attending: Emergency Medicine | Admitting: Emergency Medicine

## 2024-04-14 ENCOUNTER — Encounter (HOSPITAL_COMMUNITY): Payer: Self-pay

## 2024-04-14 ENCOUNTER — Other Ambulatory Visit: Payer: Self-pay

## 2024-04-14 DIAGNOSIS — K5900 Constipation, unspecified: Secondary | ICD-10-CM | POA: Insufficient documentation

## 2024-04-14 DIAGNOSIS — R079 Chest pain, unspecified: Secondary | ICD-10-CM | POA: Insufficient documentation

## 2024-04-14 LAB — CBC WITH DIFFERENTIAL/PLATELET
Abs Immature Granulocytes: 0.02 K/uL (ref 0.00–0.07)
Basophils Absolute: 0 K/uL (ref 0.0–0.1)
Basophils Relative: 1 %
Eosinophils Absolute: 0 K/uL (ref 0.0–0.5)
Eosinophils Relative: 0 %
HCT: 37.2 % (ref 36.0–46.0)
Hemoglobin: 12.4 g/dL (ref 12.0–15.0)
Immature Granulocytes: 0 %
Lymphocytes Relative: 20 %
Lymphs Abs: 1 K/uL (ref 0.7–4.0)
MCH: 31.6 pg (ref 26.0–34.0)
MCHC: 33.3 g/dL (ref 30.0–36.0)
MCV: 94.9 fL (ref 80.0–100.0)
Monocytes Absolute: 0.4 K/uL (ref 0.1–1.0)
Monocytes Relative: 9 %
Neutro Abs: 3.5 K/uL (ref 1.7–7.7)
Neutrophils Relative %: 70 %
Platelets: 336 K/uL (ref 150–400)
RBC: 3.92 MIL/uL (ref 3.87–5.11)
RDW: 12.9 % (ref 11.5–15.5)
WBC: 5 K/uL (ref 4.0–10.5)
nRBC: 0 % (ref 0.0–0.2)

## 2024-04-14 LAB — COMPREHENSIVE METABOLIC PANEL WITH GFR
ALT: 12 U/L (ref 0–44)
AST: 16 U/L (ref 15–41)
Albumin: 4.2 g/dL (ref 3.5–5.0)
Alkaline Phosphatase: 68 U/L (ref 38–126)
Anion gap: 8 (ref 5–15)
BUN: 7 mg/dL — ABNORMAL LOW (ref 8–23)
CO2: 30 mmol/L (ref 22–32)
Calcium: 8.8 mg/dL — ABNORMAL LOW (ref 8.9–10.3)
Chloride: 96 mmol/L — ABNORMAL LOW (ref 98–111)
Creatinine, Ser: 0.58 mg/dL (ref 0.44–1.00)
GFR, Estimated: >60 mL/min (ref >60)
Glucose, Bld: 117 mg/dL — ABNORMAL HIGH (ref 70–99)
Potassium: 4 mmol/L (ref 3.5–5.1)
Sodium: 134 mmol/L — ABNORMAL LOW (ref 135–145)
Total Bilirubin: 0.4 mg/dL (ref 0.0–1.2)
Total Protein: 6.7 g/dL (ref 6.5–8.1)

## 2024-04-14 LAB — TROPONIN T, HIGH SENSITIVITY
Troponin T High Sensitivity: <15 ng/L (ref 0–19)
Troponin T High Sensitivity: <15 ng/L (ref 0–19)

## 2024-04-14 MED ORDER — MILK OF MAGNESIA 7.75 % PO SUSP: 30.0000 mL | Freq: Every day | ORAL | 0 refills | Status: AC | PRN

## 2024-04-14 NOTE — ED Triage Notes (Signed)
 Pt arrived via POV c/o constipation. Pt reports she needs to take frequent suppositories but today has been unable to have a BM. Last BM was yesterday. Pt reports her chest and throat began to burn today as well.

## 2024-04-14 NOTE — ED Provider Notes (Signed)
 [facility] AT [facility] Provider Note   CSN: [id number] Arrival date & time: 04/14/24  1016     Patient presents with: Constipation   Madison [name] is a 89 y.o. female.  He has history of GERD, IBS, hiatal hernia.  Presents ER today complaining of burning in her chest and throat.  This started this morning.  Patient states that she got up and was getting her to take a shower, she tried to have a bowel movement and had to strain some and started having burning in her chest.  She was worried so she drove to the store and bought aspirin, she states she took 2 baby aspirin and was feeling better, she states that she sat on her couch and then started having the burning again, it lasted for couple of minutes and is now resolved but she wanted to be evaluated.  She denies history of heart disease, denies fever or chills, denies shortness of breath, pain did not radiate and was located in the chest and neck.  She does have GERD but states she took her PPI this morning.  She reports she has been having constipation for the past 6 months managed with MiraLAX , increase fiber intake and suppositories.  She is not having any abdominal pain.  To pass gas.    Constipation      Prior to Admission medications   Medication Sig Start Date End Date Taking? Authorizing Provider  amLODipine (NORVASC) 2.5 MG tablet TAKE 1 TABLET BY MOUTH EVERY DAY 10/13/17   [provider]  Calcium-Vitamin D-Vitamin K 500-100-40 MG-UNT-MCG CHEW Chew 2 each by mouth daily.      [provider]  magnesium  gluconate (MAGONATE) 500 MG tablet Take 500 mg by mouth 2 (two) times daily.    [provider]  pantoprazole  (PROTONIX ) 40 MG tablet Take 40 mg by mouth daily.    [provider]  polyethylene glycol (MIRALAX ) 17 g packet Take 17 g by mouth daily. 08/08/21   Teressa Toribio SQUIBB, MD    Allergies: Nitroglycerin and Sucralfate     Review of Systems   Gastrointestinal:  Positive for constipation.    Updated Vital Signs BP (!) 150/77 (BP Location: Right Arm)   Pulse 89   Temp 97.7 F (36.5 C) (Temporal)   Resp 16   Ht 5' 4 (1.626 m)   Wt 54.4 kg   SpO2 99%   BMI 20.60 kg/m   Physical Exam Vitals and nursing note reviewed.  Constitutional:      General: She is not in acute distress.    Appearance: She is well-developed.  HENT:     Head: Normocephalic and atraumatic.     Mouth/Throat:     Mouth: Mucous membranes are moist.  Eyes:     Extraocular Movements: Extraocular movements intact.     Conjunctiva/sclera: Conjunctivae normal.     Pupils: Pupils are equal, round, and reactive to light.  Cardiovascular:     Rate and Rhythm: Normal rate and regular rhythm.     Heart sounds: No murmur heard. Pulmonary:     Effort: Pulmonary effort is normal. No respiratory distress.     Breath sounds: Normal breath sounds.  Abdominal:     Palpations: Abdomen is soft.     Tenderness: There is no abdominal tenderness.  Musculoskeletal:        General: No swelling or tenderness.     Cervical back: Neck supple.     Right lower  leg: No edema.     Left lower leg: No edema.  Skin:    General: Skin is warm and dry.     Capillary Refill: Capillary refill takes less than 2 seconds.  Neurological:     General: No focal deficit present.     Mental Status: She is alert and oriented to person, place, and time.     Sensory: No sensory deficit.     Motor: No weakness.     Coordination: Coordination normal.     Gait: Gait normal.  Psychiatric:        Mood and Affect: Mood normal.     (all labs ordered are listed, but only abnormal results are displayed) Labs Reviewed  COMPREHENSIVE METABOLIC PANEL WITH GFR  CBC WITH DIFFERENTIAL/PLATELET  TROPONIN T, HIGH SENSITIVITY    EKG: None  Radiology: No results found.   Procedures   Medications Ordered in the ED - No data to display                                  Medical  Decision Making Differential diagnosis includes but not limited to ACS, PE, pneumonia, GERD, bowel obstruction, constipation, other  Course: Patient presented for 2 different complaints today, for she states she has been constipated for the past several months, today was not able to have a bowel movement, denies nausea or vomiting, no abdominal pain, saw GI but states they did not do anything for her at the GI office.  She is taking MiraLAX  twice a day with coffee, prune juice, and Dulcolax stool softeners at bedtime.  Abdomen is soft and nontender, KUB ordered shows small volume stool of the colon without obstructive pattern, gaseous distention of the stomach.  Viewed interpreted these images, agree with radiology read.    Second complaint was burning in her chest and throat while she was trying to have a bowel movement, happen x 2.  Has history of GERD, no pain in the ED, EKG and troponin negative x 2, patient has heart score of 3.  The burning description sounds like possible GERD.  Advised on outpatient follow-up and strict return precautions.  I discussed with patient could be increased intra-abdominal thoracic pressure from bearing down to have bowel movement caused some reflux especially with her hiatal hernia.  She did not have any dizziness or sweating, she is not having exertional pain, pain did not radiate, feel she is safe for discharge and close outpatient follow-up.  Amount and/or Complexity of Data Reviewed Labs: ordered. Radiology: ordered.  Risk OTC drugs.        Final diagnoses:  None    ED Discharge Orders     None          Suellen Sherran LABOR, PA-C 04/14/24 1835    Elnor Jayson LABOR, DO 04/15/24 [phone]

## 2024-04-14 NOTE — Discharge Instructions (Addendum)
 Seen today for constipation and burning in your chest and neck this morning. Your testing for your heart was all normal, please follow-up with your primary care doctor, come back if you have new or worsening pain, dizziness, shortness of breath or other worrisome symptoms.  Your x-ray of your abdomen did not show a blockage, there was some gas in your stomach but otherwise this was normal.  We are treating your constipation with milk of magnesia to take as needed, make sure you drink enough water, drink a full glass of water when you take the MiraLAX  or the milk of magnesia.  Call your GI doctor for close follow-up.  Come back to the ER if you are not able to pass gas, have vomiting, have abdominal pain or worsening symptoms.

## 2024-04-22 ENCOUNTER — Encounter: Payer: Self-pay | Admitting: Gastroenterology

## 2024-04-22 ENCOUNTER — Ambulatory Visit: Admitting: Gastroenterology

## 2024-04-22 VITALS — BP 122/70 | HR 70 | Ht 64.0 in | Wt 116.0 lb

## 2024-04-22 DIAGNOSIS — K449 Diaphragmatic hernia without obstruction or gangrene: Secondary | ICD-10-CM

## 2024-04-22 DIAGNOSIS — K219 Gastro-esophageal reflux disease without esophagitis: Secondary | ICD-10-CM | POA: Diagnosis not present

## 2024-04-22 DIAGNOSIS — K5909 Other constipation: Secondary | ICD-10-CM | POA: Diagnosis not present

## 2024-04-22 MED ORDER — TRULANCE 3 MG PO TABS: 1.0000 | ORAL_TABLET | Freq: Every day | ORAL | 6 refills | Status: AC

## 2024-04-22 NOTE — Patient Instructions (Addendum)
 _______________________________________________________  If your blood pressure at your visit was 140/90 or greater, please contact your primary care physician to follow up on this.  _______________________________________________________  If you are age 88 or older, your body mass index should be between 23-30. Your Body mass index is 19.91 kg/m. If this is out of the aforementioned range listed, please consider follow up with your Primary Care Provider.  If you are age 45 or younger, your body mass index should be between 19-25. Your Body mass index is 19.91 kg/m. If this is out of the aformentioned range listed, please consider follow up with your Primary Care Provider.   ________________________________________________________  The [facility] providers would like to encourage you to use MYCHART to communicate with providers for non-urgent requests or questions.  Due to long hold times on the telephone, sending your provider a message by [facility] may be a faster and more efficient way to get a response.  Please allow 48 business hours for a response.  Please remember that this is for non-urgent requests.  _______________________________________________________  [facility] is using a team-based approach to care.  Your team is made up of your doctor and two to three APPS. Our APPS (Nurse Practitioners and Physician Assistants) work with your physician to ensure care continuity for you. They are fully qualified to address your health concerns and develop a treatment plan. They communicate directly with your gastroenterologist to care for you. Seeing the Advanced Practice Practitioners on your physician's team can help you by facilitating care more promptly, often allowing for earlier appointments, access to diagnostic testing, procedures, and other specialty referrals.   Please purchase the following medications over the counter and take as directed: Miralax  17 2 times a day    Continue colace  We have sent the following medications to your pharmacy for you to pick up at your convenience: Trulance  Stop amlodipine Follow up appointment with Ms. Kaplin regarding BP and medication on 04-26-24 at 220pm. Arrive at least by 205pm  Please call Dr. Ira nurse in 2 weeks at [phone] with an update on how you are doing.  Please follow up in 3 months. Give us  a call at [phone] to schedule an appointment. Call us  in December to see about a February appointment  Thank you,  Dr. Lynnie Bring

## 2024-04-22 NOTE — Progress Notes (Signed)
 Chief Complaint: FU ED  Referring Provider:  Debrah Josette ORN., PA-C      ASSESSMENT AND PLAN;   #1. Chronic severe contipation. Neg colon 2013, (no need to rpt). Failed amitiza and linzess .  #2. GERD with HH   Plan: -Continue Miralax  17g po BID, continue colace 1/day, continue magnesium . -Trulance 3mg  QD (samples given) -Stop amolidipine. Appt with Monica Debrah in 1-2 weeks for BP check and to see if she needs another anti-HTN meds. -If still with problems, trial of senna or montegrity. -Continue Protonix  40 mg p.o. daily -Call in 2 weeks -RTC in 12 weeks.   HPI:    History of Present Illness Madison [name] is an 89 year old female with history of GERD, IBS, HH, colonic inertia   who presents with chronic constipation and recent episodes of chest discomfort. She was referred by the hospital for further evaluation after a recent episode of severe constipation requiring ED visit.  She has a history of chronic constipation that has worsened over the past six months. Previously, she had regular bowel movements every morning after breakfast, but now she experiences the urge to defecate without being able to pass stool. She has been using multiple treatments, including Milk of Magnesia, stool softeners, and Miralax  twice daily, but these have not been effective. She recalls being prescribed an orange powder by a previous doctor, which also did not help. Despite these interventions, her constipation persists.  Recently, she experienced chest burning and discomfort while straining, which prompted her to visit the hospital. She reports that testing, including an x-ray and CT scan, was performed at the hospital and she was told there was no blockage.  Her current medications include a low dose of amlodipine for blood pressure, which she has been taking for several years. She also takes Colace, a stool softener, twice daily. She maintains a diet that includes salads, fruit, and  water, and she drinks fruit juice and has recently started eating an orange every night. She reports feeling bloated and notes that her stomach appears swollen.  Her family history is significant for her mother having colon cancer at the age of 38, which was fatal. She has had a colonoscopy in the past with only one polyp found, and her last colonoscopy was many years ago with no further need indicated at that time.  She reports no blood in her stool and does not experience significant abdominal pain. She has a good appetite and no recent weight loss.  No sodas, chocolates, chewing gums, artificial sweeteners and candy. No NSAIDs    Past GI workup:  Colon 10/2011: neg (d/t FH CRC - mom >85).  No need to repeat since she had several colonoscopies previously.  CT AP 07/2022 IMPRESSION: 1. No acute intra-abdominal or pelvic pathology. 2. No bowel obstruction. 3.  Aortic Atherosclerosis (ICD10-I70.0).  Past Medical History:  Diagnosis Date   Arthritis    Bladder infection    Cancer, skin, squamous cell    Colon polyps    Frozen shoulder    left    Gallstones    GERD (gastroesophageal reflux disease)    Headache syndrome 12/22/2019   Hiatal hernia    IBS (irritable bowel syndrome)    Osteopenia    Restless leg syndrome     Past Surgical History:  Procedure Laterality Date   APPENDECTOMY     CATARACT EXTRACTION Bilateral    April and May 2021   CESAREAN SECTION  CHOLECYSTECTOMY     MOHS SURGERY Left    scalp- SCC    Family History  Problem Relation Age of Onset   Colon cancer Mother 87   Heart disease Father        MI   Lung cancer Sister    Bone cancer Sister    Heart disease Brother    Lung cancer Brother        x3   Lung cancer Brother    Diabetes Brother    Lung cancer Brother    Hypertension Brother    Diabetes Maternal Grandmother    Colon polyps Neg Hx    Liver disease Neg Hx    Kidney disease Neg Hx    Esophageal cancer Neg Hx    Pancreatic cancer  Neg Hx    Rectal cancer Neg Hx     Social History   Tobacco Use   Smoking status: Never    Passive exposure: Past   Smokeless tobacco: Never  Vaping Use   Vaping status: Never Used  Substance Use Topics   Alcohol use: Not Currently    Alcohol/week: 0.0 standard drinks of alcohol    Comment: occasionally wine   Drug use: No    Current Outpatient Medications  Medication Sig Dispense Refill   amLODipine (NORVASC) 2.5 MG tablet TAKE 1 TABLET BY MOUTH EVERY DAY     Cholecalciferol (VITAMIN D-3) 25 MCG (1000 UT) CAPS Take 1 capsule by mouth daily.     clonazePAM  (KLONOPIN ) 0.5 MG tablet Take 0.5 mg by mouth 2 (two) times daily.     docusate sodium (COLACE) 50 MG capsule Take 50 mg by mouth daily.     gabapentin (NEURONTIN) 100 MG capsule Take 100 mg by mouth 2 (two) times daily.     loratadine (CLARITIN REDITABS) 10 MG dissolvable tablet Take 10 mg by mouth daily.     Magnesium  Citrate 200 MG TABS Take 1 tablet by mouth 2 (two) times daily.     magnesium  hydroxide (MILK OF MAGNESIA) 400 MG/5ML suspension Take 30 mLs by mouth daily as needed for mild constipation. 118 mL 0   OVER THE COUNTER MEDICATION Eye drops     pantoprazole  (PROTONIX ) 40 MG tablet Take 40 mg by mouth daily.     polyethylene glycol (MIRALAX ) 17 g packet Take 17 g by mouth daily. 14 each 0   vitamin E 200 UNIT capsule Take 200 Units by mouth daily.     No current facility-administered medications for this visit.    Allergies  Allergen Reactions   Nitroglycerin Other (See Comments)    Passed out   Sucralfate  Other (See Comments)    Patient states that it makes her stomach cramp.    Review of Systems:  Constitutional: Denies fever, chills, diaphoresis, appetite change and fatigue.  HEENT: neg Respiratory: Denies SOB, DOE, cough, chest tightness,  and wheezing.   Cardiovascular: Denies chest pain, palpitations and leg swelling.  Genitourinary: Denies dysuria, urgency, frequency, hematuria, flank pain and  difficulty urinating.  Musculoskeletal: Denies myalgias, back pain, joint swelling, arthralgias and gait problem.  Skin: No rash.  Neurological: Denies dizziness, seizures, syncope, weakness, light-headedness, numbness and headaches.  Hematological: Denies adenopathy. Easy bruising, personal or family bleeding history  Psychiatric/Behavioral: No anxiety or depression     Physical Exam:    BP 122/70   Pulse 70   Ht 5' 4 (1.626 m)   Wt 116 lb (52.6 kg)   BMI 19.91 kg/m  Wt Readings from  Last 3 Encounters:  04/22/24 116 lb (52.6 kg)  04/14/24 120 lb (54.4 kg)  03/19/24 115 lb 2 oz (52.2 kg)   Constitutional:  Well-developed, in no acute distress. Psychiatric: Normal mood and affect. Behavior is normal. HEENT: Pupils normal.  Conjunctivae are normal. No scleral icterus. Cardiovascular: Normal rate, regular rhythm. No edema Pulmonary/chest: Effort normal and breath sounds normal. No wheezing, rales or rhonchi. Abdominal: Soft, mildly distended.  Nontender. Bowel sounds active throughout. There are no masses palpable. No hepatomegaly. Rectal: Deferred Neurological: Alert and oriented to person place and time. Skin: Skin is warm and dry. No rashes noted.  Data Reviewed: I have personally reviewed following labs and imaging studies  CBC:    Latest Ref Rng & Units 04/14/2024   10:43 AM 06/30/2023    7:15 PM 06/26/2023    9:07 AM  CBC  WBC 4.0 - 10.5 K/uL 5.0  5.7  5.0   Hemoglobin 12.0 - 15.0 g/dL 87.5  87.3  87.2   Hematocrit 36.0 - 46.0 % 37.2  36.8  38.2   Platelets 150 - 400 K/uL 336  308  309     CMP:    Latest Ref Rng & Units 04/14/2024   10:43 AM 06/30/2023    7:15 PM 06/26/2023    9:07 AM  CMP  Glucose 70 - 99 mg/dL 882  894  95   BUN 8 - 23 mg/dL 7  15  13    Creatinine 0.44 - 1.00 mg/dL 9.41  9.49  9.38   Sodium 135 - 145 mmol/L 134  133  134   Potassium 3.5 - 5.1 mmol/L 4.0  3.7  4.0   Chloride 98 - 111 mmol/L 96  96  97   CO2 22 - 32 mmol/L 30  26  29     Calcium 8.9 - 10.3 mg/dL 8.8  9.0  8.8   Total Protein 6.5 - 8.1 g/dL 6.7     Total Bilirubin 0.0 - 1.2 mg/dL 0.4     Alkaline Phos 38 - 126 U/L 68     AST 15 - 41 U/L 16     ALT 0 - 44 U/L 12         Radiology Studies: DG Chest Portable 1 View Result Date: 04/14/2024 EXAM: 1 VIEW(S) XRAY OF THE CHEST 04/14/2024 12:46:00 PM COMPARISON: 06/30/2023 CLINICAL HISTORY: chest pain. Pt arrived via POV c/o constipation. Pt reports she needs to take frequent suppositories but today has been unable to have a BM. Last BM was yesterday. Pt reports her chest and throat began to burn today as well. FINDINGS: LUNGS AND PLEURA: No focal pulmonary opacity. No pulmonary edema. No pleural effusion. No pneumothorax. HEART AND MEDIASTINUM: Aortic atherosclerosis. No acute abnormality of the cardiac and mediastinal silhouettes. BONES AND SOFT TISSUES: Redemonstrated calcified loose body in left axillary recess. Degenerative changes of spine. No acute osseous abnormality. IMPRESSION: 1. No acute cardiopulmonary findings. Electronically signed by: Norman Gatlin MD 04/14/2024 02:06 PM EDT RP Workstation: [id number]   DG Abdomen 1 View Result Date: 04/14/2024 EXAM: 1 VIEW XRAY OF THE ABDOMEN 04/14/2024 10:47:00 AM COMPARISON: CT at the pelvis 08/07/2022. CLINICAL HISTORY: Constipation. FINDINGS: BOWEL: Gaseous distension of the stomach. Mild colonic stool burden diffusely throughout the colon. Nonobstructive bowel gas pattern. SOFT TISSUES: Calcified fibroid overlying the pelvis. Vascular calcifications. No opaque urinary calculi. BONES: Multilevel degenerative changes of the spine. No acute osseous abnormality. IMPRESSION: 1. Mild colonic stool burden diffusely throughout the colon. 2. Moderate gaseous distention  of the stomach. Electronically signed by: Donnice Mania MD 04/14/2024 12:06 PM EDT RP Workstation: [id number]   X-ray KUB reviewed independently and with the patient.    Anselm Bring, MD 04/22/2024, 9:58  AM  Cc: Debrah Josette ORN., PA-C

## 2024-04-23 ENCOUNTER — Other Ambulatory Visit (HOSPITAL_COMMUNITY): Payer: Self-pay

## 2024-04-23 ENCOUNTER — Telehealth: Payer: Self-pay

## 2024-04-23 NOTE — Telephone Encounter (Signed)
 Pharmacy Patient Advocate Encounter   Received notification from CoverMyMeds that prior authorization for Trulance 3MG  tablets is required/requested.   Insurance verification completed.   The patient is insured through [facility] ADVANTAGE/RX ADVANCE.   Per test claim: PA required; PA submitted to above mentioned insurance via Latent Key/confirmation #/EOC [id number] Status is pending

## 2024-04-26 ENCOUNTER — Other Ambulatory Visit (HOSPITAL_COMMUNITY): Payer: Self-pay

## 2024-04-26 NOTE — Telephone Encounter (Signed)
 Pharmacy Patient Advocate Encounter  Received notification from [facility] ADVANTAGE/RX ADVANCE that Prior Authorization for  Trulance 3MG  tablets  has been APPROVED from 04-23-2024 to 06-16-2025. Ran test claim, Copay is $225.80. This test claim was processed through [facility]- copay amounts may vary at other pharmacies due to pharmacy/plan contracts, or as the patient moves through the different stages of their insurance plan.   PA #/Case ID/Reference #: [id number]

## 2024-04-28 ENCOUNTER — Telehealth: Payer: Self-pay | Admitting: Gastroenterology

## 2024-04-28 NOTE — Telephone Encounter (Signed)
 Inbound call from patient stating that Dr. Charlanne took her off her blood pressure medication when she came in for her office visit on 11/6. She states she went to her PCP and her blood pressure was high so she was put back on the medication. Patient states that her doctor was going to be reaching out to Dr. Charlanne and patient is requesting a call to discuss his thoughts. Please advise.

## 2024-04-28 NOTE — Telephone Encounter (Signed)
 As per last note OK to go back on amolidipine FU in 12 weeks RG

## 2024-04-29 ENCOUNTER — Other Ambulatory Visit: Payer: Self-pay

## 2024-04-29 DIAGNOSIS — K5904 Chronic idiopathic constipation: Secondary | ICD-10-CM

## 2024-04-29 NOTE — Telephone Encounter (Signed)
 lets get a x-ray KUB 2 view first If it shows significant stool, then we will give her a trial of Motegrity. RG

## 2024-04-29 NOTE — Telephone Encounter (Signed)
 Discussed MD recommendations with patient. We also discussed her concerns for constipation. She is currently taking Trulance 3 mg daily, miralax  BID, colace HS, and glycerine suppositories PRN. She is able to have a small BM daily only if she uses the suppositories, but stools are loose. Last seen 04/22/24 for OV with Dr. Charlanne. OV note reviewed & mentions Motegrity as an option. Will discuss with MD & give patient a call back.

## 2024-04-29 NOTE — Telephone Encounter (Signed)
 Order placed. Patient advised on when/where to go for KUB. She declined scheduling OV for 12 week f/u.

## 2024-05-04 ENCOUNTER — Ambulatory Visit (INDEPENDENT_AMBULATORY_CARE_PROVIDER_SITE_OTHER)
Admission: RE | Admit: 2024-05-04 | Discharge: 2024-05-04 | Disposition: A | Discharge status: Home / Self Care | Source: Ambulatory Visit | Attending: Gastroenterology | Admitting: Gastroenterology

## 2024-05-04 DIAGNOSIS — K5904 Chronic idiopathic constipation: Secondary | ICD-10-CM | POA: Diagnosis not present

## 2024-05-08 ENCOUNTER — Ambulatory Visit: Payer: Self-pay | Admitting: Gastroenterology

## 2024-05-10 NOTE — Telephone Encounter (Signed)
 Trulance  is expensive for the medication and she said the mediation didn't help not one big. Said the milk of magnesium  did help some but the glycerin suppositories will help a little as well. [place] that it seems like she is trying to go but feels like something is stopping it. Says she eats right and exercise. Does drink prune too. Please advise

## 2024-05-19 ENCOUNTER — Telehealth: Payer: Self-pay | Admitting: Gastroenterology

## 2024-05-19 NOTE — Telephone Encounter (Signed)
 Inbound call from patient stating she was given samples of trulance  and it did not help her go to bathroom, patient also stated that she is starting to have abdominal pain due to the chronic constipation would like to speak to nurse and be advised on what to do since magnesium  is not helping as well. Requesting a call back  Please advise  Thank you

## 2024-05-20 NOTE — Telephone Encounter (Signed)
 Spoke to patient. Patient states she took all of the samples of Trulance . Her pharmacy had to order the medication & when they called her that it was ready it was $200. Patient states she cannot afford this. Patient has been using Milk of Magnesium , Colace stool softeners and glycerin suppositories. Patient has also been taking Magnesium  gummies BID. She is also eating prunes and drinking prune juice. Patient states she is starting to feel full. Please advise.

## 2024-06-04 NOTE — Telephone Encounter (Signed)
 Patient said she will let us  know about the patient assistant application from [facility] [place] after the holiday. Said she is doing pretty good. Not like she wants to do but she is better and she already doing miralax  daily and sometimes 2 times a day as needed

## 2024-06-04 NOTE — Telephone Encounter (Signed)
 Very unfortunate that these medications are so expensive.  Can we fill forms for patient assistance? Please continue stool softeners, MiraLAX  17 g p.o. once a day or twice a day as needed, milk of magnesia as needed RG

## 2024-06-25 ENCOUNTER — Telehealth: Payer: Self-pay | Admitting: Gastroenterology

## 2024-06-25 NOTE — Telephone Encounter (Signed)
 Inbound call from patient requesting a call back to discuss what her next steps are. She states the last time she was contacted was right before christmas and she was not interested at the time. Please advise.

## 2024-06-28 NOTE — Telephone Encounter (Signed)
 Pt stated that she was calling in regard to the procedures that were recommended to her that she would have to sign. Pt chart was reviewed and and noted nothing about procedure that are bing recommended. Pt was notified of recent recommendations.  Pt stated that she takes stool softeners daily, every 4 days she takes MOM, Miralax  daily in her coffee. Pt states that she has no pain, no nausea, having BM daily or every other day. Pt stated that the trulance  did not work for her.  Pt states that if she gets where she cannot have a BM she will call our office.  Pt verbalized understanding with all questions answered.
